# Patient Record
Sex: Female | Born: 1935 | Race: White | Hispanic: No | Marital: Single
Health system: Midwestern US, Academic
[De-identification: ages and names within clinical notes are randomized; demographics above are authoritative.]

---

## 2016-09-20 ENCOUNTER — Encounter: Admit: 2016-09-20 | Discharge: 2016-09-20 | Payer: MEDICARE

## 2016-09-21 MED ORDER — METOPROLOL SUCCINATE 25 MG PO TB24
ORAL_TABLET | Freq: Every day | ORAL | 3 refills | 90.00000 days | Status: AC
Start: 2016-09-21 — End: 2016-12-31

## 2016-12-04 ENCOUNTER — Encounter: Admit: 2016-12-04 | Discharge: 2016-12-04 | Payer: MEDICARE

## 2016-12-04 DIAGNOSIS — E785 Hyperlipidemia, unspecified: Principal | ICD-10-CM

## 2016-12-04 MED ORDER — ATORVASTATIN 20 MG PO TAB
20 mg | ORAL_TABLET | Freq: Every day | ORAL | 3 refills | Status: AC
Start: 2016-12-04 — End: 2016-12-31

## 2016-12-08 ENCOUNTER — Encounter: Admit: 2016-12-08 | Discharge: 2016-12-08 | Payer: MEDICARE

## 2016-12-08 DIAGNOSIS — I2510 Atherosclerotic heart disease of native coronary artery without angina pectoris: Principal | ICD-10-CM

## 2016-12-08 DIAGNOSIS — I10 Essential (primary) hypertension: ICD-10-CM

## 2016-12-08 DIAGNOSIS — E785 Hyperlipidemia, unspecified: ICD-10-CM

## 2016-12-14 ENCOUNTER — Encounter: Admit: 2016-12-14 | Discharge: 2016-12-14 | Payer: MEDICARE

## 2016-12-14 DIAGNOSIS — E785 Hyperlipidemia, unspecified: ICD-10-CM

## 2016-12-14 DIAGNOSIS — I10 Essential (primary) hypertension: ICD-10-CM

## 2016-12-14 DIAGNOSIS — I2510 Atherosclerotic heart disease of native coronary artery without angina pectoris: Principal | ICD-10-CM

## 2016-12-14 LAB — LIPID PROFILE
Lab: 24
Lab: 88
Lab: 121
Lab: 150
Lab: 4

## 2016-12-14 LAB — AST (SGOT): Lab: 22 mg/dL

## 2016-12-14 LAB — ALT (SGPT): Lab: 17

## 2016-12-31 ENCOUNTER — Encounter: Admit: 2016-12-31 | Discharge: 2016-12-31 | Payer: MEDICARE

## 2016-12-31 ENCOUNTER — Ambulatory Visit: Admit: 2016-12-31 | Discharge: 2017-01-01 | Payer: MEDICARE

## 2016-12-31 DIAGNOSIS — E785 Hyperlipidemia, unspecified: ICD-10-CM

## 2016-12-31 DIAGNOSIS — I10 Essential (primary) hypertension: Principal | ICD-10-CM

## 2016-12-31 DIAGNOSIS — I872 Venous insufficiency (chronic) (peripheral): Principal | ICD-10-CM

## 2016-12-31 DIAGNOSIS — R079 Chest pain, unspecified: ICD-10-CM

## 2016-12-31 DIAGNOSIS — G459 Transient cerebral ischemic attack, unspecified: ICD-10-CM

## 2016-12-31 DIAGNOSIS — N390 Urinary tract infection, site not specified: ICD-10-CM

## 2016-12-31 DIAGNOSIS — I2510 Atherosclerotic heart disease of native coronary artery without angina pectoris: ICD-10-CM

## 2016-12-31 MED ORDER — RANOLAZINE 1,000 MG PO TB12
1000 mg | Freq: Two times a day (BID) | ORAL | 0 refills | Status: AC
Start: 2016-12-31 — End: 2016-12-31

## 2016-12-31 MED ORDER — ATORVASTATIN 20 MG PO TAB
20 mg | ORAL_TABLET | Freq: Every day | ORAL | 3 refills | Status: AC
Start: 2016-12-31 — End: 2017-11-04

## 2016-12-31 MED ORDER — RANOLAZINE 1,000 MG PO TB12
1000 mg | ORAL_TABLET | Freq: Two times a day (BID) | ORAL | 5 refills | Status: AC
Start: 2016-12-31 — End: 2017-01-07

## 2016-12-31 MED ORDER — METOPROLOL SUCCINATE 25 MG PO TB24
25 mg | ORAL_TABLET | Freq: Every day | ORAL | 3 refills | 90.00000 days | Status: AC
Start: 2016-12-31 — End: 2017-10-01

## 2016-12-31 MED ORDER — HYDROCHLOROTHIAZIDE 12.5 MG PO TAB
12.5 mg | ORAL_TABLET | Freq: Every morning | ORAL | 3 refills | 30.00000 days | Status: AC
Start: 2016-12-31 — End: 2017-11-04

## 2016-12-31 NOTE — Progress Notes
Date of Service: 12/31/2016    Jillian Rose is a 83 y.o. female.       HPI     Jillian Rose was in the [place] office today for follow-up regarding coronary disease.  She is a delightful lady but she is a little bit difficult to manage because whenever I seen her the problem that she is having that day is the worst thing she is ever had.  When I then see her back 6 months later is always something else and her issues are never anything that I seem to be able to fix very easily.    Despite all this she goes down to [place] every winter and seems to do fine.  She has a bicycle that she rides around and she generally feels a lot better while she is down in [place].    She was coughing a lot in the exam room before I went in.  As we were talking during the exam, however, she stopped coughing entirely.  The cough is a little bit of a wheezy sounding cough to me and honestly sounds more like airway reactivity than it does a dry cough associated with increased lung water.    She has never really had typical angina symptoms but when I had her on Ranexa last year her breathlessness really did seem to get better.  She stopped the drug on her own about 6 months ago because she was concerned about the cost.    I gave her a new prescription for Ranexa today and asked her to call around town to see if she could find a good price for the drug.  She also mentioned the prescription drug program through [facility] and asked whether there would be some possibility about getting the Ranexa through that program.    She is not having any palpitations nor is she having lightheadedness or syncope.  She is complaining about left leg pain and edema.  When we talked about this 6 months ago I suggested that she get compression stockings because the venous duplex we did that day was negative.  She has not followed up on this but I did give her the name of a company in [place] that deals with compression therapy.         Vitals: 12/31/16 0852 12/31/16 0904   BP: 138/90 132/82   Pulse: 100    Weight: 86.4 kg (190 lb 6.4 oz)    Height: 1.575 m (5' 2)      Body mass index is 34.82 kg/m???.     Past Medical History  Patient Active Problem List    Diagnosis Date Noted   ??? Left leg pain 06/18/2016     06/2016 - Left leg swelling and pain.  Venous Duplex negative.  Compression stockings recommended.     ??? Chest pain 10/02/2013   ??? HLD (hyperlipidemia) 10/02/2013   ??? Nephrolithiasis 06/08/2013     03/2013 - Spontaneous passage of kidney stone while in [place]     ??? Venous insufficiency 01/23/2013   ??? Near syncope 10/30/2012   ??? Dyslipidemia 08/04/2012     2014 - Atorvastatin started after CABG and NSTEMI    07/20/12 - Total 155, LDL 101, HDL 44, TG 119.  Atorvastatin doubled to 20 mg/day on 5/29.     ??? CAD (coronary artery disease) 08/03/2012     03/2012- NSTEMI complicating pneumonia.  EF 40-45% by echo.  60% left main, 99% proximal LAD, 60-70% mid-circumflex.  Normal  dominant RCA.  EF 40%, distal anterior severe hypokinesis.  03/22/2012 - CABG X 2 (LIMA-LAD, SVG-OM-2).  [facility], [place], [place]., Dr. Barrett Henle.  06/26/15 Echo+Doppler: EF 65%. Mild Abnormal Septal Motion. Mild concentric LVH. Mild LA dilation. Mild AV regurgitation. PASP 37 mmHg.     ??? HTN (hypertension) 08/03/2012   ??? TIA (transient ischemic attack) 08/03/2012     08/01/2008     ??? Asthma 08/03/2012   ??? Sleep apnea 08/03/2012   ??? GERD (gastroesophageal reflux disease) 08/03/2012         Review of Systems   Constitution: Negative.   HENT: Negative.    Eyes: Negative.    Cardiovascular: Positive for dyspnea on exertion and irregular heartbeat.   Respiratory: Positive for cough and shortness of breath.    Endocrine: Negative.    Hematologic/Lymphatic: Negative.    Skin: Negative.    Musculoskeletal: Negative.    Gastrointestinal: Negative.    Genitourinary: Negative.    Neurological: Positive for numbness and paresthesias.   Psychiatric/Behavioral: Negative. Allergic/Immunologic: Negative.        Physical Exam    Physical Exam   General Appearance: no distress   Skin: warm, no ulcers or xanthomas   Digits and Nails: no cyanosis or clubbing   Eyes: conjunctivae and lids normal, pupils are equal and round   Teeth/Gums/Palate: dentition unremarkable, no lesions   Lips & Oral Mucosa: no pallor or cyanosis   Neck Veins: normal JVP , neck veins are not distended   Thyroid: no nodules, masses, tenderness or enlargement   Chest Inspection: chest is normal in appearance   Respiratory Effort: breathing comfortably, no respiratory distress   Auscultation/Percussion: lungs clear to auscultation, no rales or rhonchi, no wheezing   PMI: PMI not enlarged or displaced   Cardiac Rhythm: regular rhythm and normal rate   Cardiac Auscultation: S1, S2 normal, no rub, no gallop   Murmurs: no murmur   Peripheral Circulation: normal peripheral circulation   Carotid Arteries: normal carotid upstroke bilaterally, no bruits   Radial Arteries: normal symmetric radial pulses   Abdominal Aorta: no abdominal aortic bruit   Pedal Pulses: normal symmetric pedal pulses   Lower Extremity Edema: 1+ left lower extremity edema   Abdominal Exam: soft, non-tender, no masses, bowel sounds normal   Liver & Spleen: no organomegaly   Gait & Station: walks without assistance   Muscle Strength: normal muscle tone   Orientation: oriented to time, place and person   Affect & Mood: appropriate and sustained affect   Language and Memory: patient responsive and seems to comprehend information   Neurologic Exam: neurological assessment grossly intact   Other: moves all extremities        Problems Addressed Today  Encounter Diagnoses   Name Primary?   ??? Venous insufficiency    ??? Coronary artery disease involving native coronary artery of native heart without angina pectoris    ??? Hyperlipidemia, unspecified hyperlipidemia type    ??? Dyslipidemia        Assessment and Plan       Venous insufficiency She needs to try compression stockings for her left leg--she has quite a bit of difficulty with swelling and discomfort.  I think she'll probably need thigh-high compression stockings and I'll look into where she can get fitted for them.    CAD (coronary artery disease)  I don't really think her dyspnea symptoms are likely to be related to her epicardial coronary disease.  I do think she has  a component of endothelial dysfunction which contributes to her diastolic heart failure.  I think she would do better if we could get her back on ranolazine.    There is some sort of prescription drug plan (340-b) available in [place] that she mentioned and we'll look into whether this would help her with the cost of Ranexa.    HLD (hyperlipidemia)  Lab Results   Component Value Date    CHOL 150 12/14/2016    TRIG 121 12/14/2016    HDL 42 12/14/2016    LDL 88 12/14/2016    VLDL 24 12/14/2016    CHOLHDLC 4 12/14/2016      LDL pretty close to goal (< 70).      Current Medications (including today's revisions)  ??? aspirin EC 81 mg tablet Take 81 mg by mouth daily.   ??? atorvastatin (LIPITOR) 20 mg tablet Take one tablet by mouth daily.   ??? brimonidine(+) (ALPHAGAN) 0.2 % ophthalmic solution Insert or Apply 1 Drop to left eye as directed twice daily.   ??? fluticasone (FLONASE) 50 mcg/actuation nasal spray Apply 2 sprays to each nostril as directed daily. Shake bottle gently before using.   ??? fluticasone/salmeterol (ADVAIR DISKUS) 250/50 mcg inhalation disk Inhale 1 puff by mouth into the lungs every 12 hours.   ??? glucosamine-chondroitin 500-400 mg tablet Take 1 tablet by mouth daily.   ??? hydroCHLOROthiazide (HYDRODIURIL) 12.5 mg tablet Take one tablet by mouth every morning.   ??? lactobacillus combination no.4 (SENIOR PROBIOTIC) 15 billion cell cap Take 1 Cap by mouth daily.   ??? magnesium oxide (MAG-OX) 400 mg tablet Take 400 mg by mouth daily.   ??? metoprolol XL (TOPROL XL) 25 mg extended release tablet Take one tablet by mouth daily.   ??? nitroglycerin (NITROSTAT) 0.4 mg tablet Place 1 Tab under tongue every 5 minutes as needed for Chest Pain.   ??? omeprazole DR(+) (PRILOSEC) 20 mg capsule Take 1 Cap by mouth daily.   ??? potassium chloride SR (K-DUR) 20 mEq tablet Take 20 mEq by mouth daily. Take with a meal and a full glass of water.   ??? ranolazine ER (RANEXA) 1,000 mg tablet Take one tablet by mouth twice daily.   ??? tiotropium (SPIRIVA WITH HANDIHALER) 18 mcg capsule for inhaler Place 18 mcg into inhaler and inhale into lungs as directed daily.   ??? vitamins, multiple tablet Take 1 Tab by mouth daily.

## 2016-12-31 NOTE — Assessment & Plan Note
She needs to try compression stockings for her left leg--she has quite a bit of difficulty with swelling and discomfort.  I think she'll probably need thigh-high compression stockings and I'll look into where she can get fitted for them.

## 2016-12-31 NOTE — Assessment & Plan Note
Lab Results   Component Value Date    CHOL 150 12/14/2016    TRIG 121 12/14/2016    HDL 42 12/14/2016    LDL 88 12/14/2016    VLDL 24 12/14/2016    CHOLHDLC 4 12/14/2016      LDL pretty close to goal (< 70).

## 2017-01-05 ENCOUNTER — Encounter: Admit: 2017-01-05 | Discharge: 2017-01-05 | Payer: MEDICARE

## 2017-01-05 NOTE — Telephone Encounter
Pt called to report that the Ranexa is $233.00 per month even with the 340-b program which is for the [facility] pts only.  Even then the cost is $233 per month.  She states that she cannot afford it but  did purchase a one month supply.  Pt would like to be changed to a different medication.. Will send to Dr.Owens for his review and recommendation.

## 2017-01-06 ENCOUNTER — Encounter: Admit: 2017-01-06 | Discharge: 2017-01-06 | Payer: MEDICARE

## 2017-01-06 NOTE — Telephone Encounter
01/06/2017 11:35 AM Patient of Dr. Barry Dienes called today to report symptoms of pounding heartbeat and arm/hand pain like "pins and needles", both of which woke her up last night.  Patient took her first dose of Ranexa 1000 mg BID last night, 01/05/2017, around 9 PM.  At midnight, she "started to feel her heart beating hard."  At 3 AM, she work up with bilateral arm and hand pain and paresthesia.  Patient asks if she should cut her dose in half.  In the past, patient was prescribed 500 mg BID.  Per patient, she did not have side effects on this dosing.    Dr. Barry Dienes prescribed 1000 mg BID at OV on 12/31/2016 to help relieve symptoms of breathlessness.  Patient could not get to the pharmacy until 01/05/17, so took her first dose that evening.    Patient is also concerned about the cost of the medication.  She paid $233 for a 30-day supply.    This morning, patient's symptoms are resolved.  She did not take her morning dose.    I told patient I would send a message to Dr. Barry Dienes for recommendation regarding Ranexa dosing and will follow up with her.  Asked patient to continue to hold the Ranexa until we called her back.    If patient remains on Ranexa, will ask [facility] RNs to follow-up on cost concerns.  Dr. Barry Dienes mentions some prescription drug options in his 12/31/16 OV notes that patient was going to explore.     Patient will go to [place] on November 28 for the winter.    Sent staff message to Dr. Barry Dienes.

## 2017-01-07 MED ORDER — RANOLAZINE 500 MG PO TB12
500 mg | ORAL_TABLET | Freq: Two times a day (BID) | ORAL | 3 refills | Status: AC
Start: 2017-01-07 — End: 2017-01-07

## 2017-01-07 MED ORDER — RANOLAZINE 500 MG PO TB12
500 mg | ORAL_TABLET | Freq: Two times a day (BID) | ORAL | 3 refills | Status: AC
Start: 2017-01-07 — End: 2017-11-04

## 2017-01-08 MED ORDER — ISOSORBIDE MONONITRATE 60 MG PO TB24
60 mg | ORAL_TABLET | Freq: Every morning | ORAL | 3 refills | 30.00000 days | Status: AC
Start: 2017-01-08 — End: 2017-01-19

## 2017-01-08 NOTE — Telephone Encounter
Message   Received: 2 days ago   Message Contents   Vanice Sarah, MD  Cordella Register, RN   Caller: Unspecified (3 days ago, 3:42 PM)            I guess we could switch to Imdur 60 mg/day after the Ranexa runs out. Thanks.    Previous Messages

## 2017-01-08 NOTE — Telephone Encounter
Spoke with pt and she was given the information as below.  Pt wants to pick up a signed script on 01/19/17 so she can take to [place] with her as she will be there until April of 2019.

## 2017-01-19 ENCOUNTER — Encounter: Admit: 2017-01-19 | Discharge: 2017-01-19 | Payer: MEDICARE

## 2017-01-19 MED ORDER — ISOSORBIDE MONONITRATE 60 MG PO TB24
60 mg | ORAL_TABLET | Freq: Every morning | ORAL | 3 refills | 30.00000 days | Status: AC
Start: 2017-01-19 — End: 2017-11-04

## 2017-01-19 NOTE — Telephone Encounter
-----   Message from Cordella RegisterJoyce Wilkerson, RN sent at 01/08/2017  9:12 AM CDT -----  Regarding: Needs script to take with her  Pt wants a signed script for the Imdur to take with her to [place]

## 2017-09-30 ENCOUNTER — Encounter: Admit: 2017-09-30 | Discharge: 2017-09-30 | Payer: MEDICARE

## 2017-10-01 MED ORDER — METOPROLOL SUCCINATE 25 MG PO TB24
ORAL_TABLET | Freq: Every day | ORAL | 1 refills | 90.00000 days | Status: AC
Start: 2017-10-01 — End: 2017-11-04

## 2017-11-04 ENCOUNTER — Ambulatory Visit: Admit: 2017-11-04 | Discharge: 2017-11-05 | Payer: MEDICARE

## 2017-11-04 ENCOUNTER — Encounter: Admit: 2017-11-04 | Discharge: 2017-11-04 | Payer: MEDICARE

## 2017-11-04 DIAGNOSIS — G4733 Obstructive sleep apnea (adult) (pediatric): ICD-10-CM

## 2017-11-04 DIAGNOSIS — N390 Urinary tract infection, site not specified: ICD-10-CM

## 2017-11-04 DIAGNOSIS — R55 Syncope and collapse: Principal | ICD-10-CM

## 2017-11-04 DIAGNOSIS — R079 Chest pain, unspecified: ICD-10-CM

## 2017-11-04 DIAGNOSIS — E785 Hyperlipidemia, unspecified: ICD-10-CM

## 2017-11-04 DIAGNOSIS — I10 Essential (primary) hypertension: Principal | ICD-10-CM

## 2017-11-04 DIAGNOSIS — I2510 Atherosclerotic heart disease of native coronary artery without angina pectoris: ICD-10-CM

## 2017-11-04 DIAGNOSIS — G459 Transient cerebral ischemic attack, unspecified: ICD-10-CM

## 2017-11-04 MED ORDER — ATORVASTATIN 20 MG PO TAB
20 mg | ORAL_TABLET | Freq: Every day | ORAL | 3 refills | Status: AC
Start: 2017-11-04 — End: 2018-04-07

## 2017-11-04 MED ORDER — METOPROLOL SUCCINATE 25 MG PO TB24
25 mg | ORAL_TABLET | Freq: Every day | ORAL | 3 refills | 90.00000 days | Status: AC
Start: 2017-11-04 — End: 2018-04-07

## 2017-11-04 MED ORDER — HYDROCHLOROTHIAZIDE 12.5 MG PO TAB
12.5 mg | ORAL_TABLET | Freq: Every morning | ORAL | 3 refills | 30.00000 days | Status: AC
Start: 2017-11-04 — End: 2018-04-07

## 2017-11-04 MED ORDER — OMEPRAZOLE 20 MG PO CPDR
20 mg | ORAL_CAPSULE | Freq: Every day | ORAL | 3 refills | Status: AC
Start: 2017-11-04 — End: 2018-04-07

## 2017-11-04 MED ORDER — ISOSORBIDE MONONITRATE 60 MG PO TB24
60 mg | ORAL_TABLET | Freq: Every morning | ORAL | 3 refills | 30.00000 days | Status: AC
Start: 2017-11-04 — End: 2018-08-18

## 2018-04-07 ENCOUNTER — Encounter: Admit: 2018-04-07 | Discharge: 2018-04-07 | Payer: MEDICARE

## 2018-04-07 DIAGNOSIS — E785 Hyperlipidemia, unspecified: Secondary | ICD-10-CM

## 2018-04-07 MED ORDER — HYDROCHLOROTHIAZIDE 12.5 MG PO TAB
12.5 mg | ORAL_TABLET | Freq: Every morning | ORAL | 3 refills | 30.00000 days | Status: AC
Start: 2018-04-07 — End: 2018-08-18

## 2018-04-07 MED ORDER — ATORVASTATIN 20 MG PO TAB
20 mg | ORAL_TABLET | Freq: Every day | ORAL | 3 refills | Status: AC
Start: 2018-04-07 — End: 2018-08-18

## 2018-04-07 MED ORDER — OMEPRAZOLE 20 MG PO CPDR
20 mg | ORAL_CAPSULE | Freq: Every day | ORAL | 3 refills | Status: AC
Start: 2018-04-07 — End: 2018-08-18

## 2018-04-07 MED ORDER — METOPROLOL SUCCINATE 25 MG PO TB24
25 mg | ORAL_TABLET | Freq: Every day | ORAL | 3 refills | 90.00000 days | Status: AC
Start: 2018-04-07 — End: 2018-08-18

## 2018-08-18 ENCOUNTER — Ambulatory Visit: Admit: 2018-08-18 | Discharge: 2018-08-19

## 2018-08-18 ENCOUNTER — Encounter: Admit: 2018-08-18 | Discharge: 2018-08-18

## 2018-08-18 DIAGNOSIS — I10 Essential (primary) hypertension: Secondary | ICD-10-CM

## 2018-08-18 DIAGNOSIS — E785 Hyperlipidemia, unspecified: Secondary | ICD-10-CM

## 2018-08-18 DIAGNOSIS — N390 Urinary tract infection, site not specified: Secondary | ICD-10-CM

## 2018-08-18 DIAGNOSIS — I2510 Atherosclerotic heart disease of native coronary artery without angina pectoris: Secondary | ICD-10-CM

## 2018-08-18 DIAGNOSIS — G459 Transient cerebral ischemic attack, unspecified: Secondary | ICD-10-CM

## 2018-08-18 DIAGNOSIS — R079 Chest pain, unspecified: Secondary | ICD-10-CM

## 2018-08-18 MED ORDER — HYDROCHLOROTHIAZIDE 12.5 MG PO TAB
12.5 mg | ORAL_TABLET | Freq: Every morning | ORAL | 3 refills | 30.00000 days | Status: DC
Start: 2018-08-18 — End: 2019-07-20

## 2018-08-18 MED ORDER — METOPROLOL SUCCINATE 25 MG PO TB24
25 mg | ORAL_TABLET | Freq: Every day | ORAL | 3 refills | 90.00000 days | Status: DC
Start: 2018-08-18 — End: 2019-07-20

## 2018-08-18 MED ORDER — OMEPRAZOLE 20 MG PO CPDR
20 mg | ORAL_CAPSULE | Freq: Every day | ORAL | 3 refills | Status: DC
Start: 2018-08-18 — End: 2019-07-20

## 2018-08-18 MED ORDER — ATORVASTATIN 20 MG PO TAB
20 mg | ORAL_TABLET | Freq: Every day | ORAL | 3 refills | Status: DC
Start: 2018-08-18 — End: 2019-07-18

## 2018-08-18 MED ORDER — ISOSORBIDE MONONITRATE 60 MG PO TB24
60 mg | ORAL_TABLET | Freq: Every morning | ORAL | 3 refills | 30.00000 days | Status: DC
Start: 2018-08-18 — End: 2019-07-03

## 2018-08-18 NOTE — Progress Notes
Date of Service: 08/18/2018    Jillian Rose is a 85 y.o. female.       HPI     Jillian Rose was in the [place] office for follow-up regarding coronary disease.  She had a great winter down in [place] and is so happy to be spending time with her new significant other, a man from [place], [place].  They separate to their homes in the summer months, but get together again down in [place] for the winter.    She's having a bit more trouble with her reactive airways disease now that she's back in [place].  She's currently on azythromycin and corticosteroid.    She really hasn't had any more trouble with angina, palpitations, or light headedness.  She denies any TIA symptoms.  Her BP has been doing well on the current medications.    She denies any claudication or fluid retention.         Vitals:    08/18/18 1550 08/18/18 1559   BP: 128/74 124/72   BP Source: Arm, Left Upper Arm, Right Upper   Pulse: 66    SpO2: 96%    Weight: 79.8 kg (176 lb)    Height: 1.575 m (5' 2)    PainSc: Zero      Body mass index is 32.19 kg/m???.     Past Medical History  Patient Active Problem List    Diagnosis Date Noted   ??? Left leg pain 06/18/2016     06/2016 - Left leg swelling and pain.  Venous Duplex negative.  Compression stockings recommended.     ??? Chest pain 10/02/2013   ??? HLD (hyperlipidemia) 10/02/2013   ??? Nephrolithiasis 06/08/2013     03/2013 - Spontaneous passage of kidney stone while in [place]     ??? Venous insufficiency 01/23/2013   ??? Near syncope 10/30/2012   ??? Dyslipidemia 08/04/2012     2014 - Atorvastatin started after CABG and NSTEMI    07/20/12 - Total 155, LDL 101, HDL 44, TG 119.  Atorvastatin doubled to 20 mg/day on 5/29.     ??? CAD (coronary artery disease) 08/03/2012     03/2012- NSTEMI complicating pneumonia.  EF 40-45% by echo.  60% left main, 99% proximal LAD, 60-70% mid-circumflex.  Normal dominant RCA.  EF 40%, distal anterior severe hypokinesis.  03/22/2012 - CABG X 2 (LIMA-LAD, SVG-OM-2).  [facility], [place], [place]., Dr. Barrett Henle.  06/26/15 Echo+Doppler: EF 65%. Mild Abnormal Septal Motion. Mild concentric LVH. Mild LA dilation. Mild AV regurgitation. PASP 37 mmHg.     ??? HTN (hypertension) 08/03/2012   ??? TIA (transient ischemic attack) 08/03/2012     08/01/2008     ??? Asthma 08/03/2012   ??? Sleep apnea 08/03/2012   ??? GERD (gastroesophageal reflux disease) 08/03/2012         Review of Systems   Constitution: Negative.   HENT: Negative.    Eyes: Negative.    Cardiovascular: Negative.    Respiratory: Positive for shortness of breath.    Endocrine: Negative.    Hematologic/Lymphatic: Negative.    Skin: Negative.    Musculoskeletal: Negative.    Gastrointestinal: Negative.    Genitourinary: Negative.    Neurological: Negative.    Psychiatric/Behavioral: Negative.    Allergic/Immunologic: Negative.        Physical Exam    General Appearance: no distress   Skin: warm, no ulcers or xanthomas   Digits and Nails: no cyanosis or clubbing   Eyes: conjunctivae and  lids normal, pupils are equal and round   Teeth/Gums/Palate: dentition unremarkable, no lesions   Lips & Oral Mucosa: no pallor or cyanosis   Neck Veins: normal JVP , neck veins are not distended   Thyroid: no nodules, masses, tenderness or enlargement   Chest Inspection: chest is normal in appearance   Respiratory Effort: breathing comfortably, no respiratory distress   Auscultation/Percussion: lungs clear to auscultation, no rales or rhonchi, no wheezing   PMI: PMI not enlarged or displaced   Cardiac Rhythm: regular rhythm and normal rate   Cardiac Auscultation: S1, S2 normal, no rub, no gallop   Murmurs: no murmur   Peripheral Circulation: normal peripheral circulation   Carotid Arteries: normal carotid upstroke bilaterally, no bruits   Radial Arteries: normal symmetric radial pulses   Abdominal Aorta: no abdominal aortic bruit   Pedal Pulses: normal symmetric pedal pulses   Lower Extremity Edema: no lower extremity edema Abdominal Exam: soft, non-tender, no masses, bowel sounds normal   Liver & Spleen: no organomegaly   Gait & Station: walks without assistance   Muscle Strength: normal muscle tone   Orientation: oriented to time, place and person   Affect & Mood: appropriate and sustained affect   Language and Memory: patient responsive and seems to comprehend information   Neurologic Exam: neurological assessment grossly intact   Other: moves all extremities      Problems Addressed Today  Encounter Diagnoses   Name Primary?   ??? Dyslipidemia    ??? Coronary artery disease involving native coronary artery of native heart without angina pectoris    ??? Essential hypertension        Assessment and Plan       Dyslipidemia  Lab Results   Component Value Date    CHOL 150 08/19/2018    TRIG 253 (H) 08/19/2018    HDL 34 (L) 08/19/2018    LDL 65 08/19/2018    VLDL 51 (H) 08/19/2018    CHOLHDLC 4 08/19/2018      LDL looks great on current therapy.     CAD (coronary artery disease)  No angina, I'm not planning surveillance stress testing unless she develops worrisome symptoms.    HTN (hypertension)  BP treated to goal, average less than 130/80.      Current Medications (including today's revisions)  ??? aspirin EC 81 mg tablet Take 81 mg by mouth daily.   ??? atorvastatin (LIPITOR) 20 mg tablet Take one tablet by mouth daily.   ??? benzonatate (TESSALON) 200 mg capsule Take 1 capsule by mouth as Needed.   ??? brimonidine(+) (ALPHAGAN) 0.2 % ophthalmic solution Insert or Apply 1 Drop to left eye as directed twice daily.   ??? cholecalciferol (VITAMIN D) 1,000 units tablet Take 1,000 Units by mouth daily.   ??? fluticasone/salmeterol (ADVAIR DISKUS) 250/50 mcg inhalation disk Inhale 1 puff by mouth into the lungs every 12 hours.   ??? hydroCHLOROthiazide (HYDRODIURIL) 12.5 mg tablet Take one tablet by mouth every morning.   ??? isosorbide mononitrate SR (IMDUR) 60 mg tablet Take one tablet by mouth every morning. ??? lactobacillus combination no.4 (SENIOR PROBIOTIC) 15 billion cell cap Take 1 Cap by mouth daily.   ??? metoprolol XL (TOPROL XL) 25 mg extended release tablet Take one tablet by mouth daily.   ??? neomycin/polymyxin/HC (CORTISPORIN) 3.5-10,000-1 mg-unit/mL-% otic suspension Place 4 drops in affected ear(s) as directed three times daily.   ??? omeprazole DR (PRILOSEC) 20 mg capsule Take one capsule by mouth daily.   ???  Potassium 99 mg tab Take 1 tablet by mouth daily.   ??? timolol maleate (TIMOPTIC) 0.5 % ophthalmic drops 1 drop twice daily.   ??? tiotropium (SPIRIVA WITH HANDIHALER) 18 mcg capsule for inhaler Place 18 mcg into inhaler and inhale into lungs as directed daily.   ??? vitamins, multiple tablet Take 1 Tab by mouth daily.

## 2018-08-18 NOTE — Patient Instructions
1.  We'll order lab work for [facility] lab  2.  New prescriptions sent today.

## 2018-08-19 ENCOUNTER — Encounter: Admit: 2018-08-19 | Discharge: 2018-08-19

## 2018-08-19 DIAGNOSIS — E785 Hyperlipidemia, unspecified: Secondary | ICD-10-CM

## 2018-08-19 LAB — CBC
Lab: 4.3
Lab: 7.7

## 2018-08-19 LAB — COMPREHENSIVE METABOLIC PANEL
Lab: 0.5
Lab: 3.4
Lab: 6.7
Lab: 91
Lab: 106
Lab: 13
Lab: 143 — ABNORMAL HIGH (ref <150)
Lab: 28 — ABNORMAL HIGH (ref 5–40)
Lab: 4.6 — ABNORMAL LOW (ref >40)

## 2018-08-19 LAB — LIPID PROFILE: Lab: 150

## 2018-08-20 NOTE — Assessment & Plan Note
BP treated to goal, average less than 130/80.

## 2018-08-20 NOTE — Assessment & Plan Note
No angina, I'm not planning surveillance stress testing unless she develops worrisome symptoms.

## 2018-08-22 ENCOUNTER — Encounter: Admit: 2018-08-22 | Discharge: 2018-08-22

## 2018-08-22 NOTE — Telephone Encounter
Called pt with lab results. Pt very pleased we called with results.  Labs sent to [facility] for review.  Pt reports she wasn't fasting a full 10-12 hours prior.  She was only fasting about 6 hours.  Advised pt we would call her back if [facility] has further recommendations.

## 2019-07-03 MED ORDER — ISOSORBIDE MONONITRATE 60 MG PO TB24
ORAL_TABLET | Freq: Every morning | ORAL | 3 refills | 30.00000 days | Status: AC
Start: 2019-07-03 — End: ?

## 2019-07-18 ENCOUNTER — Encounter: Admit: 2019-07-18 | Discharge: 2019-07-18 | Payer: MEDICARE

## 2019-07-18 DIAGNOSIS — E785 Hyperlipidemia, unspecified: Secondary | ICD-10-CM

## 2019-07-18 MED ORDER — ATORVASTATIN 20 MG PO TAB
ORAL_TABLET | Freq: Every day | 3 refills | Status: AC
Start: 2019-07-18 — End: ?

## 2019-07-19 ENCOUNTER — Encounter: Admit: 2019-07-19 | Discharge: 2019-07-19 | Payer: MEDICARE

## 2019-07-20 MED ORDER — METOPROLOL SUCCINATE 25 MG PO TB24
ORAL_TABLET | Freq: Every day | ORAL | 3 refills | 90.00000 days | Status: AC
Start: 2019-07-20 — End: ?

## 2019-07-20 MED ORDER — OMEPRAZOLE 20 MG PO CPDR
ORAL_CAPSULE | Freq: Every day | 3 refills | Status: AC
Start: 2019-07-20 — End: ?

## 2019-07-20 MED ORDER — HYDROCHLOROTHIAZIDE 12.5 MG PO TAB
ORAL_TABLET | Freq: Every morning | ORAL | 3 refills | 30.00000 days | Status: AC
Start: 2019-07-20 — End: ?

## 2019-08-23 ENCOUNTER — Encounter: Admit: 2019-08-23 | Discharge: 2019-08-23 | Payer: MEDICARE

## 2019-08-23 DIAGNOSIS — I4891 Unspecified atrial fibrillation: Secondary | ICD-10-CM

## 2019-08-23 DIAGNOSIS — I639 Cerebral infarction, unspecified: Secondary | ICD-10-CM

## 2019-10-05 ENCOUNTER — Encounter: Admit: 2019-10-05 | Discharge: 2019-10-05 | Payer: MEDICARE

## 2019-10-05 DIAGNOSIS — N390 Urinary tract infection, site not specified: Secondary | ICD-10-CM

## 2019-10-05 DIAGNOSIS — I2510 Atherosclerotic heart disease of native coronary artery without angina pectoris: Secondary | ICD-10-CM

## 2019-10-05 DIAGNOSIS — I10 Essential (primary) hypertension: Secondary | ICD-10-CM

## 2019-10-05 DIAGNOSIS — E785 Hyperlipidemia, unspecified: Secondary | ICD-10-CM

## 2019-10-05 DIAGNOSIS — R079 Chest pain, unspecified: Secondary | ICD-10-CM

## 2019-10-05 DIAGNOSIS — G459 Transient cerebral ischemic attack, unspecified: Secondary | ICD-10-CM

## 2019-10-05 NOTE — Assessment & Plan Note
Lab Results   Component Value Date    CHOL 150 08/19/2018    TRIG 253 (H) 08/19/2018    HDL 34 (L) 08/19/2018    LDL 65 08/19/2018    VLDL 51 (H) 08/19/2018    CHOLHDLC 4 08/19/2018      LDL treated to goal.

## 2019-10-05 NOTE — Progress Notes
Date of Service: 10/05/2019    Jillian Rose is a 86 y.o. female.       HPI     Jillian Rose was in the [facility] today for follow-up regarding coronary disease.  She still has her boyfriend in [place]; he's living back at his home in [place] for the summer.  They did go through with a Blessing of a Committed Relationship through the [employer] down in [place].    She has a lot of trouble with allergies, sinusitis, and reactive airways problems when she's back in [place].  She sees Dr. Doran Rose for her reactive airways disease.  She's been having more trouble with palpitations dating back to her time in [place] last winter.    [facility]  She's out working in her yard all the time.    She is having a little bit of exertional breathlessness but this is pretty normal for her when she is back in [place]. She is not having angina. She denies any symptoms that would suggest sustained tachycardia and she has had no syncope or near syncope. She denies any TIA or stroke symptoms.         Vitals:    10/05/19 0941 10/05/19 0942   BP: 138/70 136/70   BP Source: Arm, Left Upper Arm, Right Upper   Patient Position: Sitting Sitting   Pulse: 98    SpO2: 97%    Weight: 78.8 kg (173 lb 12.8 oz)    Height: 1.575 m (5' 2)    PainSc: Zero      Body mass index is 31.79 kg/m?Marland Kitchen     Past Medical History  Patient Active Problem List    Diagnosis Date Noted   ? Left leg pain 06/18/2016     06/2016 - Left leg swelling and pain.  Venous Duplex negative.  Compression stockings recommended.     ? Chest pain 10/02/2013   ? HLD (hyperlipidemia) 10/02/2013   ? Nephrolithiasis 06/08/2013     03/2013 - Spontaneous passage of kidney stone while in [place]     ? Venous insufficiency 01/23/2013   ? Near syncope 10/30/2012   ? Dyslipidemia 08/04/2012     2014 - Atorvastatin started after CABG and NSTEMI    07/20/12 - Total 155, LDL 101, HDL 44, TG 119.  Atorvastatin doubled to 20 mg/day on 5/29.     ? CAD (coronary artery disease) 08/03/2012     03/2012- NSTEMI complicating pneumonia.  EF 40-45% by echo.  60% left main, 99% proximal LAD, 60-70% mid-circumflex.  Normal dominant RCA.  EF 40%, distal anterior severe hypokinesis.  03/22/2012 - CABG X 2 (LIMA-LAD, SVG-OM-2).  [facility], [place], [place]., Dr. Barrett Henle.  06/26/15 Echo+Doppler: EF 65%. Mild Abnormal Septal Motion. Mild concentric LVH. Mild LA dilation. Mild AV regurgitation. PASP 37 mmHg.     ? HTN (hypertension) 08/03/2012   ? TIA (transient ischemic attack) 08/03/2012     08/01/2008     ? Asthma 08/03/2012   ? Sleep apnea 08/03/2012   ? GERD (gastroesophageal reflux disease) 08/03/2012         Review of Systems   Constitution: Negative.   HENT: Positive for congestion.    Eyes: Negative.    Cardiovascular: Negative.    Respiratory: Positive for shortness of breath.    Endocrine: Negative.    Hematologic/Lymphatic: Negative.    Skin: Negative.    Musculoskeletal: Negative.    Gastrointestinal: Negative.    Genitourinary:  Negative.    Neurological: Negative.    Psychiatric/Behavioral: Negative.    Allergic/Immunologic: Negative.        Physical Exam    Physical Exam   General Appearance: no distress   Skin: warm, no ulcers or xanthomas   Digits and Nails: no cyanosis or clubbing   Eyes: conjunctivae and lids normal, pupils are equal and round   Teeth/Gums/Palate: dentition unremarkable, no lesions   Lips & Oral Mucosa: no pallor or cyanosis   Neck Veins: normal JVP , neck veins are not distended   Thyroid: no nodules, masses, tenderness or enlargement   Chest Inspection: chest is normal in appearance   Respiratory Effort: breathing comfortably, no respiratory distress   Auscultation/Percussion: lungs clear to auscultation, no rales or rhonchi, no wheezing   PMI: PMI not enlarged or displaced   Cardiac Rhythm: regular rhythm and normal rate   Cardiac Auscultation: S1, S2 normal, no rub, no gallop   Murmurs: no murmur   Peripheral Circulation: normal peripheral circulation   Carotid Arteries: normal carotid upstroke bilaterally, no bruits   Radial Arteries: normal symmetric radial pulses   Abdominal Aorta: no abdominal aortic bruit   Pedal Pulses: normal symmetric pedal pulses   Lower Extremity Edema: no lower extremity edema   Abdominal Exam: soft, non-tender, no masses, bowel sounds normal   Liver & Spleen: no organomegaly   Gait & Station: walks without assistance   Muscle Strength: normal muscle tone   Orientation: oriented to time, place and person   Affect & Mood: appropriate and sustained affect   Language and Memory: patient responsive and seems to comprehend information   Neurologic Exam: neurological assessment grossly intact   Other: moves all extremities      Cardiovascular Studies    EKG:  Sinus rhythm, rate 89.  Normal EKG    Problems Addressed Today  Encounter Diagnoses   Name Primary?   ? Coronary artery disease involving native coronary artery of native heart without angina pectoris    ? Hyperlipidemia, unspecified hyperlipidemia type    ? Essential hypertension        Assessment and Plan       CAD (coronary artery disease)  She isn't having angina symptoms.    HLD (hyperlipidemia)  Lab Results   Component Value Date    CHOL 150 08/19/2018    TRIG 253 (H) 08/19/2018    HDL 34 (L) 08/19/2018    LDL 65 08/19/2018    VLDL 51 (H) 08/19/2018    CHOLHDLC 4 08/19/2018      LDL treated to goal.    HTN (hypertension)  BP looks fine on current medical therapy.      Current Medications (including today's revisions)  ? aspirin EC 81 mg tablet Take 81 mg by mouth daily.   ? atorvastatin (LIPITOR) 20 mg tablet TAKE 1 TABLET EVERY DAY   ? benzonatate (TESSALON) 200 mg capsule Take 1 capsule by mouth as Needed.   ? brimonidine(+) (ALPHAGAN) 0.2 % ophthalmic solution Insert or Apply 1 Drop to left eye as directed twice daily.   ? cholecalciferol (VITAMIN D) 1,000 units tablet Take 1,000 Units by mouth daily.   ? fluticasone/salmeterol (ADVAIR DISKUS) 250/50 mcg inhalation disk Inhale 1 puff by mouth into the lungs every 12 hours.   ? hydroCHLOROthiazide (HYDRODIURIL) 12.5 mg tablet TAKE 1 TABLET EVERY MORNING   ? isosorbide mononitrate ER (IMDUR) 60 mg tablet TAKE 1 TABLET EVERY MORNING   ? lactobacillus combination no.4 (SENIOR PROBIOTIC)  15 billion cell cap Take 1 Cap by mouth daily.   ? latanoprost (XALATAN) 0.005 % ophthalmic solution    ? metoprolol XL (TOPROL XL) 25 mg extended release tablet TAKE 1 TABLET EVERY DAY   ? neomycin/polymyxin/HC (CORTISPORIN) 3.5-10,000-1 mg-unit/mL-% otic suspension Place 4 drops in affected ear(s) as directed three times daily.   ? omeprazole DR (PRILOSEC) 20 mg capsule TAKE 1 CAPSULE EVERY DAY   ? Potassium 99 mg tab Take 1 tablet by mouth daily.   ? timolol maleate (TIMOPTIC) 0.5 % ophthalmic drops 1 drop twice daily.   ? tiotropium (SPIRIVA WITH HANDIHALER) 18 mcg capsule for inhaler Place 18 mcg into inhaler and inhale into lungs as directed daily.   ? vitamins, multiple tablet Take 1 Tab by mouth daily.   ? Zinc 50 mg tab Take  by mouth.     Total time spent on today's office visit was 30 minutes.  This includes face-to-face in person visit with patient as well as nonface-to-face time including review of the EMR, outside records, labs, radiologic studies, echocardiogram & other cardiovascular studies, formation of treatment plan, after visit summary, future disposition, and lastly on documentation.

## 2019-10-05 NOTE — Assessment & Plan Note
She isn't having angina symptoms.

## 2019-10-05 NOTE — Assessment & Plan Note
BP looks fine on current medical therapy.

## 2020-06-17 ENCOUNTER — Encounter: Admit: 2020-06-17 | Discharge: 2020-06-17 | Payer: MEDICARE

## 2020-06-17 DIAGNOSIS — E785 Hyperlipidemia, unspecified: Secondary | ICD-10-CM

## 2020-06-17 MED ORDER — HYDROCHLOROTHIAZIDE 12.5 MG PO TAB
ORAL_TABLET | Freq: Every morning | ORAL | 3 refills | 30.00000 days | Status: AC
Start: 2020-06-17 — End: ?

## 2020-06-17 MED ORDER — ATORVASTATIN 20 MG PO TAB
ORAL_TABLET | Freq: Every day | 3 refills | Status: AC
Start: 2020-06-17 — End: ?

## 2020-06-17 MED ORDER — OMEPRAZOLE 20 MG PO CPDR
ORAL_CAPSULE | Freq: Every day | 3 refills | Status: AC
Start: 2020-06-17 — End: ?

## 2020-06-17 MED ORDER — METOPROLOL SUCCINATE 25 MG PO TB24
ORAL_TABLET | Freq: Every day | ORAL | 3 refills | 90.00000 days | Status: AC
Start: 2020-06-17 — End: ?

## 2020-06-19 ENCOUNTER — Encounter: Admit: 2020-06-19 | Discharge: 2020-06-19 | Payer: MEDICARE

## 2020-06-19 NOTE — Telephone Encounter
-----   Message from Vanice Sarah, MD sent at 06/19/2020  3:08 PM CDT -----  I got a call from Fredia Sorrow and Jaycey is in her clinic with AF and feeling poorly.  Rate OK.  Starting Eliquis 2.5 mg BID today.  I see my schedule is a disaster tomorrow, but I"ll need to see [place] tomorrow morning in [place].  I may offer  admit to get her cardioverted sooner than July if we have to schedule it outpatient!  Can you please call her to give her a time for tomorrow AM?  Thanks.

## 2020-06-19 NOTE — Telephone Encounter
Called pt to schedule appointment tomorrow at 8:45 in [place] with [facility].  Pt confirmed appointment time and location.

## 2020-06-20 ENCOUNTER — Encounter: Admit: 2020-06-20 | Discharge: 2020-06-20 | Payer: MEDICARE

## 2020-06-20 DIAGNOSIS — R079 Chest pain, unspecified: Secondary | ICD-10-CM

## 2020-06-20 DIAGNOSIS — I2510 Atherosclerotic heart disease of native coronary artery without angina pectoris: Secondary | ICD-10-CM

## 2020-06-20 DIAGNOSIS — E785 Hyperlipidemia, unspecified: Secondary | ICD-10-CM

## 2020-06-20 DIAGNOSIS — I4891 Unspecified atrial fibrillation: Secondary | ICD-10-CM

## 2020-06-20 DIAGNOSIS — I4819 Other persistent atrial fibrillation: Secondary | ICD-10-CM

## 2020-06-20 DIAGNOSIS — I491 Atrial premature depolarization: Secondary | ICD-10-CM

## 2020-06-20 DIAGNOSIS — I10 Essential (primary) hypertension: Secondary | ICD-10-CM

## 2020-06-20 DIAGNOSIS — G459 Transient cerebral ischemic attack, unspecified: Secondary | ICD-10-CM

## 2020-06-20 DIAGNOSIS — N390 Urinary tract infection, site not specified: Secondary | ICD-10-CM

## 2020-06-20 NOTE — Assessment & Plan Note
She definitely has coronary disease and for that reason I do not think we can use something like flecainide.  I do not think she is having symptoms of cardiac ischemia currently and I am not planning a stress test at this point.

## 2020-06-20 NOTE — Assessment & Plan Note
Her blood pressure seems okay on the current medical program.

## 2020-06-20 NOTE — Assessment & Plan Note
Lab Results   Component Value Date    CHOL 150 08/19/2018    TRIG 253 (H) 08/19/2018    HDL 34 (L) 08/19/2018    LDL 65 08/19/2018    VLDL 51 (H) 08/19/2018    CHOLHDLC 4 08/19/2018      ]

## 2020-06-20 NOTE — Assessment & Plan Note
She was started on Eliquis yesterday.  Her ventricular rate is reasonable and I do not think we need to make any changes in metoprolol dosing at this point.  We have no idea how long she has been in atrial fibrillation.  She does have some symptoms so I think it is reasonable to make an effort to get her back into sinus rhythm.  We talked about timing and the option of either going to [place] for TEE cardioversion or waiting a month for anticoagulation and an elective cardioversion here in [place].  I think her symptoms status is such that the latter option will be acceptable.  We will plan on getting a transthoracic echocardiogram here in [place] within the next week or 2.  We talked about the possibility of starting an antiarrhythmic medication a week or so before the cardioversion, most likely amiodarone.  If the medication does not convert her then will schedule a cardioversion here in [place] on May 12.  She will need to be on anticoagulation indefinitely because of the silent nature of her arrhythmia.  She will probably also need to be on an antiarrhythmic agent such as amiodarone indefinitely as well.

## 2020-06-20 NOTE — Progress Notes
Date of Service: 06/20/2020    Jillian Rose is a 87 y.o. female.       HPI     Jillian Rose was in the [facility] [place] clinic today for an urgent workin visit regarding new onset atrial fibrillation.  She was accompanied by her daughter.    Her medical insurance provided a home health nurse who did a status check yesterday and determined that Jillian Rose's rhythm was irregular.  She was seen by Dr. Alona Bene in clinic yesterday and an EKG confirms atrial fibrillation with a ventricular rate in the range of 80-90.    Jillian Rose was unaware of the arrhythmia.  She always has an exacerbation of her allergies and reactive airways disease when she comes back to [place] from [place].  It sounds like the weather in [place] was pretty poor are all winter with a lot of wind that kept things stirred up there and I think she probably had trouble with her asthma all winter long.  I do not know that her breathing issues are specifically related to the atrial fibrillation but she is having a lot of complaints about fatigue and a little bit of peripheral edema and I think those symptoms may be more likely to be related to the atrial fibrillation.    It is impossible to tell how long she has been out of rhythm.  Fortunately she has not had any TIA or stroke symptoms.    She is not having any angina or other types of cardiac ischemic symptoms.  She has had no syncope or near syncope.  Again, she denies any palpitations.         Vitals:    06/20/20 0812   BP: 132/78   BP Source: Arm, Right Upper   Pulse: 117   SpO2: 98%   O2 Device: None (Room air)   PainSc: Zero   Weight: 77.1 kg (170 lb)   Height: 157.5 cm (5' 2)     Body mass index is 31.09 kg/m?Marland Kitchen     Past Medical History  Patient Active Problem List    Diagnosis Date Noted   ? Persistent atrial fibrillation (HCC) 06/20/2020   ? Left leg pain 06/18/2016     06/2016 - Left leg swelling and pain.  Venous Duplex negative.  Compression stockings recommended.     ? Chest pain 10/02/2013   ? HLD (hyperlipidemia) 10/02/2013   ? Nephrolithiasis 06/08/2013     03/2013 - Spontaneous passage of kidney stone while in [place]     ? Venous insufficiency 01/23/2013   ? Near syncope 10/30/2012   ? Dyslipidemia 08/04/2012     2014 - Atorvastatin started after CABG and NSTEMI    07/20/12 - Total 155, LDL 101, HDL 44, TG 119.  Atorvastatin doubled to 20 mg/day on 5/29.     ? CAD (coronary artery disease) 08/03/2012     03/2012- NSTEMI complicating pneumonia.  EF 40-45% by echo.  60% left main, 99% proximal LAD, 60-70% mid-circumflex.  Normal dominant RCA.  EF 40%, distal anterior severe hypokinesis.  03/22/2012 - CABG X 2 (LIMA-LAD, SVG-OM-2).  [facility], [place], [place]., Dr. Barrett Henle.  06/26/15 Echo+Doppler: EF 65%. Mild Abnormal Septal Motion. Mild concentric LVH. Mild LA dilation. Mild AV regurgitation. PASP 37 mmHg.     ? HTN (hypertension) 08/03/2012   ? TIA (transient ischemic attack) 08/03/2012     08/01/2008     ? Asthma 08/03/2012   ? Sleep apnea 08/03/2012   ? GERD (gastroesophageal reflux  disease) 08/03/2012         Review of Systems   Constitutional: Positive for malaise/fatigue.   HENT: Negative.    Eyes: Negative.    Cardiovascular: Positive for dyspnea on exertion and irregular heartbeat.   Respiratory: Negative.    Endocrine: Negative.    Hematologic/Lymphatic: Negative.    Skin: Negative.    Musculoskeletal: Negative.    Gastrointestinal: Negative.    Genitourinary: Negative.    Neurological: Negative.    Psychiatric/Behavioral: Negative.    Allergic/Immunologic: Negative.        Physical Exam    Physical Exam   General Appearance: no distress   Skin: warm, no ulcers or xanthomas   Digits and Nails: no cyanosis or clubbing   Eyes: conjunctivae and lids normal, pupils are equal and round   Teeth/Gums/Palate: dentition unremarkable, no lesions   Lips & Oral Mucosa: no pallor or cyanosis   Neck Veins: normal JVP , neck veins are not distended   Thyroid: no nodules, masses, tenderness or enlargement   Chest Inspection: chest is normal in appearance   Respiratory Effort: breathing comfortably, no respiratory distress   Auscultation/Percussion: lungs clear to auscultation, no rales or rhonchi, no wheezing   PMI: PMI not enlarged or displaced   Cardiac Rhythm: irregular rhythm and normal rate   Cardiac Auscultation: S1, S2 normal, no rub, no gallop   Murmurs: no murmur   Peripheral Circulation: normal peripheral circulation   Carotid Arteries: normal carotid upstroke bilaterally, no bruits   Radial Arteries: normal symmetric radial pulses   Abdominal Aorta: no abdominal aortic bruit   Pedal Pulses: normal symmetric pedal pulses   Lower Extremity Edema: trace bilateral lower extremity edema   Abdominal Exam: soft, non-tender, no masses, bowel sounds normal   Liver & Spleen: no organomegaly   Gait & Station: walks without assistance   Muscle Strength: normal muscle tone   Orientation: oriented to time, place and person   Affect & Mood: appropriate and sustained affect   Language and Memory: patient responsive and seems to comprehend information   Neurologic Exam: neurological assessment grossly intact   Other: moves all extremities      Cardiovascular Studies    EKG:  Atrial fibrillation, rate 97    Cardiovascular Health Factors  Vitals BP Readings from Last 3 Encounters:   06/20/20 132/78   10/05/19 136/70   08/18/18 124/72     Wt Readings from Last 3 Encounters:   06/20/20 77.1 kg (170 lb)   10/05/19 78.8 kg (173 lb 12.8 oz)   08/18/18 79.8 kg (176 lb)     BMI Readings from Last 3 Encounters:   06/20/20 31.09 kg/m?   10/05/19 31.79 kg/m?   08/18/18 32.19 kg/m?      Smoking Social History     Tobacco Use   Smoking Status Never Smoker   Smokeless Tobacco Never Used      Lipid Profile Cholesterol   Date Value Ref Range Status   08/19/2018 150  Final     HDL   Date Value Ref Range Status   08/19/2018 34 (L) >40 Final     LDL   Date Value Ref Range Status   08/19/2018 65  Final     Triglycerides   Date Value Ref Range Status   08/19/2018 253 (H) <150 Final      Blood Sugar No results found for: HGBA1C  Glucose   Date Value Ref Range Status   01/09/2020 128 (H) 70 - 105  Final   08/19/2018 91  Final   10/03/2013 100 70 - 100 MG/DL Final          Problems Addressed Today  Encounter Diagnoses   Name Primary?   ? Coronary artery disease involving native coronary artery of native heart without angina pectoris Yes   ? Primary hypertension    ? Finding of multiple premature atrial contractions by electrocardiography    ? Persistent atrial fibrillation (HCC)    ? Hyperlipidemia, unspecified hyperlipidemia type        Assessment and Plan       Persistent atrial fibrillation (HCC)  She was started on Eliquis yesterday.  Her ventricular rate is reasonable and I do not think we need to make any changes in metoprolol dosing at this point.  We have no idea how long she has been in atrial fibrillation.  She does have some symptoms so I think it is reasonable to make an effort to get her back into sinus rhythm.  We talked about timing and the option of either going to [place] for TEE cardioversion or waiting a month for anticoagulation and an elective cardioversion here in [place].  I think her symptoms status is such that the latter option will be acceptable.  We will plan on getting a transthoracic echocardiogram here in [place] within the next week or 2.  We talked about the possibility of starting an antiarrhythmic medication a week or so before the cardioversion, most likely amiodarone.  If the medication does not convert her then will schedule a cardioversion here in [place] on May 12.  She will need to be on anticoagulation indefinitely because of the silent nature of her arrhythmia.  She will probably also need to be on an antiarrhythmic agent such as amiodarone indefinitely as well.    HTN (hypertension)  Her blood pressure seems okay on the current medical program.    HLD (hyperlipidemia)  Lab Results   Component Value Date    CHOL 150 08/19/2018    TRIG 253 (H) 08/19/2018    HDL 34 (L) 08/19/2018    LDL 65 08/19/2018    VLDL 51 (H) 08/19/2018    CHOLHDLC 4 08/19/2018      ]    CAD (coronary artery disease)  She definitely has coronary disease and for that reason I do not think we can use something like flecainide.  I do not think she is having symptoms of cardiac ischemia currently and I am not planning a stress test at this point.      Current Medications (including today's revisions)  ? aspirin EC 81 mg tablet Take 81 mg by mouth daily.   ? atorvastatin (LIPITOR) 20 mg tablet TAKE 1 TABLET EVERY DAY   ? benzonatate (TESSALON) 200 mg capsule Take 1 capsule by mouth as Needed.   ? brimonidine(+) (ALPHAGAN) 0.2 % ophthalmic solution Insert or Apply 1 Drop to left eye as directed twice daily.   ? cholecalciferol (VITAMIN D) 1,000 units tablet Take 1,000 Units by mouth daily.   ? ELIQUIS 2.5 mg tablet Take 1 tablet by mouth twice daily.   ? fluticasone/salmeterol (ADVAIR DISKUS) 250/50 mcg inhalation disk Inhale 1 puff by mouth into the lungs every 12 hours.   ? hydroCHLOROthiazide 12.5 mg tablet TAKE 1 TABLET EVERY MORNING   ? isosorbide mononitrate ER (IMDUR) 60 mg tablet TAKE 1 TABLET EVERY MORNING   ? lactobacillus combination no.4 (SENIOR PROBIOTIC) 15 billion cell cap Take 1 Cap by mouth daily.   ?  latanoprost (XALATAN) 0.005 % ophthalmic solution    ? metoprolol XL (TOPROL XL) 25 mg extended release tablet TAKE 1 TABLET EVERY DAY   ? neomycin/polymyxin/HC (CORTISPORIN) 3.5-10,000-1 mg-unit/mL-% otic suspension Place 4 drops in affected ear(s) as directed three times daily.   ? omeprazole DR (PRILOSEC) 20 mg capsule TAKE 1 CAPSULE EVERY DAY   ? timolol maleate (TIMOPTIC) 0.5 % ophthalmic drops 1 drop twice daily.   ? tiotropium (SPIRIVA WITH HANDIHALER) 18 mcg capsule for inhaler Place 18 mcg into inhaler and inhale into lungs as directed daily.   ? vitamins, multiple tablet Take 1 Tab by mouth daily.   ? Zinc 50 mg tab Take by mouth.     Total time spent on today's office visit was 30 minutes.  This includes face-to-face in person visit with patient as well as nonface-to-face time including review of the EMR, outside records, labs, radiologic studies, echocardiogram & other cardiovascular studies, formation of treatment plan, after visit summary, future disposition, and lastly on documentation.

## 2020-06-27 ENCOUNTER — Ambulatory Visit: Admit: 2020-06-27 | Discharge: 2020-06-27 | Payer: MEDICARE

## 2020-06-27 ENCOUNTER — Encounter: Admit: 2020-06-27 | Discharge: 2020-06-27 | Payer: MEDICARE

## 2020-06-27 DIAGNOSIS — I10 Essential (primary) hypertension: Secondary | ICD-10-CM

## 2020-06-27 DIAGNOSIS — I2510 Atherosclerotic heart disease of native coronary artery without angina pectoris: Secondary | ICD-10-CM

## 2020-07-04 ENCOUNTER — Encounter: Admit: 2020-07-04 | Discharge: 2020-07-04 | Payer: MEDICARE

## 2020-07-04 NOTE — Telephone Encounter
This patient called requesting results of echocardiogram completed on 06/27/20 at the [facility] hospital. It appears there are some changes from the last echo in 2017. Will route to [facility] for his review and recommendations then follow up with the patient.

## 2020-07-05 ENCOUNTER — Encounter: Admit: 2020-07-05 | Discharge: 2020-07-05 | Payer: MEDICARE

## 2020-07-05 DIAGNOSIS — Z5181 Encounter for therapeutic drug level monitoring: Secondary | ICD-10-CM

## 2020-07-05 DIAGNOSIS — I4819 Other persistent atrial fibrillation: Secondary | ICD-10-CM

## 2020-07-05 MED ORDER — AMIODARONE 400 MG PO TAB
400 mg | ORAL_TABLET | Freq: Every day | ORAL | 3 refills | 42.00000 days | Status: AC
Start: 2020-07-05 — End: ?
  Filled 2020-08-08: qty 30, 30d supply, fill #1

## 2020-07-05 NOTE — Progress Notes
Labs and/or CXR for amiodarone surveillance due per Amiodarone protocol.  Lab requisitions/orders  needed for testing faxed to [facility] hospital.    Discussed new medication and monitoring testing required. Patient verbalizes understanding and agreement of plan of care. All questions answered at this time. Patient has call back number for further questions and concerns.

## 2020-07-15 ENCOUNTER — Encounter: Admit: 2020-07-15 | Discharge: 2020-07-15 | Payer: MEDICARE

## 2020-07-15 DIAGNOSIS — Z5181 Encounter for therapeutic drug level monitoring: Secondary | ICD-10-CM

## 2020-07-15 DIAGNOSIS — I4819 Other persistent atrial fibrillation: Secondary | ICD-10-CM

## 2020-07-18 ENCOUNTER — Encounter: Admit: 2020-07-18 | Discharge: 2020-07-18 | Payer: MEDICARE

## 2020-07-18 MED ORDER — ELIQUIS 2.5 MG PO TAB
2.5 mg | ORAL_TABLET | Freq: Two times a day (BID) | ORAL | 3 refills | Status: AC
Start: 2020-07-18 — End: ?

## 2020-07-22 ENCOUNTER — Encounter: Admit: 2020-07-22 | Discharge: 2020-07-22 | Payer: MEDICARE

## 2020-07-23 ENCOUNTER — Encounter: Admit: 2020-07-23 | Discharge: 2020-07-23 | Payer: MEDICARE

## 2020-07-25 ENCOUNTER — Encounter: Admit: 2020-07-25 | Discharge: 2020-07-25 | Payer: MEDICARE

## 2020-07-25 MED ORDER — APIXABAN 2.5 MG PO TAB
5 mg | Freq: Two times a day (BID) | ORAL | 0 refills | Status: AC
Start: 2020-07-25 — End: ?

## 2020-07-25 NOTE — Telephone Encounter
Spoke to pt re: f/u as below and pt agreeable to f/u with Dr. Danella Maiers [facility]) in [place] 5/31 at 11:15 for further management of her AF. She confirmed her amio was dc'd during recent hospitalization and Eliquis dose increased. Med list updated accordingly. Sent msg to [place] scheduling to add pt to schedule. No further needs identified at this time.    ----- Message -----  From: Lauralee Evener, RN  Sent: 07/25/2020  11:36 AM CDT  To: Caryl Pina, RN  Subject: RE: Work In for 5/31                             I think it would be fine to put her on 5/31 but probably have her come at 11:15.    ----- Message -----  From: Caryl Pina, RN  Sent: 07/25/2020  11:05 AM CDT  To: Lauralee Evener, RN  Subject: FW: Work In for 5/31                             Do you think we could add her on 5/31 at 11:30?    ----- Message -----  From: Doy Mince, RN  Sent: 07/25/2020  10:19 AM CDT  To: Sallee Provencal, Cvm Nurse [place]  Subject: RE: Work In for 5/31                             Sending to [place] nurses    ----- Message -----  From: Sallee Provencal  Sent: 07/25/2020  10:18 AM CDT  To: Cvm Nurse [facility]  Subject: Work In for 5/31                                 Good Morning,     This pt is wanting to get in to see SDO in [place] before her appt on 6/23. She was seen in ED a couple of days ago, and they recommend that she is seen sooner than 6/23. Is there anywhere we could work her in.     Thank you.

## 2020-07-30 ENCOUNTER — Encounter: Admit: 2020-07-30 | Discharge: 2020-07-30 | Payer: MEDICARE

## 2020-08-06 ENCOUNTER — Encounter: Admit: 2020-08-06 | Discharge: 2020-08-06 | Payer: MEDICARE

## 2020-08-06 ENCOUNTER — Inpatient Hospital Stay: Admit: 2020-08-06 | Payer: MEDICARE

## 2020-08-06 ENCOUNTER — Inpatient Hospital Stay: Admit: 2020-08-06 | Discharge: 2020-08-06 | Payer: MEDICARE

## 2020-08-06 DIAGNOSIS — I10 Essential (primary) hypertension: Secondary | ICD-10-CM

## 2020-08-06 DIAGNOSIS — I4892 Unspecified atrial flutter: Secondary | ICD-10-CM

## 2020-08-06 DIAGNOSIS — J449 Chronic obstructive pulmonary disease, unspecified: Secondary | ICD-10-CM

## 2020-08-06 DIAGNOSIS — I2510 Atherosclerotic heart disease of native coronary artery without angina pectoris: Secondary | ICD-10-CM

## 2020-08-06 DIAGNOSIS — R079 Chest pain, unspecified: Secondary | ICD-10-CM

## 2020-08-06 DIAGNOSIS — N390 Urinary tract infection, site not specified: Secondary | ICD-10-CM

## 2020-08-06 DIAGNOSIS — G459 Transient cerebral ischemic attack, unspecified: Secondary | ICD-10-CM

## 2020-08-06 DIAGNOSIS — I25721 Atherosclerosis of autologous artery coronary artery bypass graft(s) with angina pectoris with documented spasm: Secondary | ICD-10-CM

## 2020-08-06 DIAGNOSIS — E785 Hyperlipidemia, unspecified: Secondary | ICD-10-CM

## 2020-08-06 DIAGNOSIS — I4819 Other persistent atrial fibrillation: Secondary | ICD-10-CM

## 2020-08-06 DIAGNOSIS — Z9289 Personal history of other medical treatment: Secondary | ICD-10-CM

## 2020-08-06 LAB — COMPREHENSIVE METABOLIC PANEL
ALBUMIN: 4 g/dL (ref 3.5–5.0)
ALK PHOSPHATASE: 84 U/L (ref 25–110)
ALT: 17 U/L (ref 7–56)
ANION GAP: 11 (ref 3–12)
AST: 43 U/L — ABNORMAL HIGH (ref 7–40)
BLD UREA NITROGEN: 18 mg/dL (ref 7–25)
CALCIUM: 9.4 mg/dL (ref 8.5–10.6)
CHLORIDE: 104 MMOL/L (ref 98–110)
CO2: 25 MMOL/L (ref 21–30)
CREATININE: 1 mg/dL — ABNORMAL HIGH (ref 0.4–1.00)
EGFR: 54 mL/min — ABNORMAL LOW (ref >60)
GLUCOSE,PANEL: 132 mg/dL — ABNORMAL HIGH (ref 70–100)
SODIUM: 140 MMOL/L (ref 137–147)
TOTAL BILIRUBIN: 0.6 mg/dL (ref 0.3–1.2)
TOTAL PROTEIN: 6.6 g/dL (ref 6.0–8.0)

## 2020-08-06 LAB — CBC AND DIFF
ABSOLUTE BASO COUNT: 0 K/UL (ref 0–0.20)
ABSOLUTE EOS COUNT: 0.1 K/UL (ref 0–0.45)
ABSOLUTE LYMPH COUNT: 2.2 K/UL (ref 1.0–4.8)
ABSOLUTE MONO COUNT: 0.6 K/UL (ref 0–0.80)
ABSOLUTE NEUTROPHIL: 3.6 K/UL (ref 1.8–7.0)
BASOPHILS %: 1 % (ref 0–2)
EOSINOPHILS %: 2 % (ref 0–5)
HEMATOCRIT: 45 % — ABNORMAL HIGH (ref 36–45)
LYMPHOCYTES %: 33 % (ref 24–44)
MCH: 33 pg (ref 26–34)
MCHC: 35 g/dL (ref 32.0–36.0)
MONOCYTES %: 9 % (ref 4–12)
MPV: 8.1 FL (ref 7–11)
NEUTROPHILS %: 55 % (ref 41–77)
PLATELET COUNT: 309 K/UL (ref 150–400)
RDW: 14 % (ref 11–15)
WBC COUNT: 6.7 K/UL (ref 4.5–11.0)

## 2020-08-06 LAB — PROTIME INR (PT)
INR: 1.3 g/dL — ABNORMAL HIGH (ref 0.8–1.2)
PROTIME: 14 s — ABNORMAL HIGH (ref 9.5–14.2)

## 2020-08-06 LAB — PHOSPHORUS: PHOSPHORUS: 3.8 mg/dL — ABNORMAL LOW (ref >40)

## 2020-08-06 LAB — PTT (APTT): PTT: 32 s (ref 24.0–36.5)

## 2020-08-06 LAB — MAGNESIUM: MAGNESIUM: 2 mg/dL — ABNORMAL HIGH (ref 1.6–2.6)

## 2020-08-06 LAB — BNP (B-TYPE NATRIURETIC PEPTI): BNP: 134 pg/mL — ABNORMAL HIGH (ref 0–100)

## 2020-08-06 MED ORDER — APIXABAN 5 MG PO TAB
5 mg | Freq: Two times a day (BID) | ORAL | 0 refills | Status: AC
Start: 2020-08-06 — End: ?
  Administered 2020-08-07 – 2020-08-08 (×4): 5 mg via ORAL

## 2020-08-06 MED ORDER — SENNOSIDES-DOCUSATE SODIUM 8.6-50 MG PO TAB
1 | Freq: Every day | ORAL | 0 refills | Status: AC | PRN
Start: 2020-08-06 — End: ?

## 2020-08-06 MED ORDER — CHOLECALCIFEROL (VITAMIN D3) 25 MCG (1,000 UNIT) PO TAB
1000 [IU] | Freq: Every day | ORAL | 0 refills | Status: AC
Start: 2020-08-06 — End: ?
  Administered 2020-08-07 – 2020-08-08 (×2): 1000 [IU] via ORAL

## 2020-08-06 MED ORDER — POLYETHYLENE GLYCOL 3350 17 GRAM PO PWPK
1 | Freq: Every day | ORAL | 0 refills | Status: DC | PRN
Start: 2020-08-06 — End: 2020-08-06

## 2020-08-06 MED ORDER — ISOSORBIDE MONONITRATE 60 MG PO TB24
60 mg | Freq: Every day | ORAL | 0 refills | Status: AC
Start: 2020-08-06 — End: ?
  Administered 2020-08-07 – 2020-08-08 (×2): 60 mg via ORAL

## 2020-08-06 MED ORDER — HYDROCHLOROTHIAZIDE 25 MG PO TAB
12.5 mg | Freq: Every morning | ORAL | 0 refills | Status: AC
Start: 2020-08-06 — End: ?
  Administered 2020-08-07 – 2020-08-08 (×2): 12.5 mg via ORAL

## 2020-08-06 MED ORDER — POLYETHYLENE GLYCOL 3350 17 GRAM PO PWPK
1 | Freq: Every day | ORAL | 0 refills | Status: AC
Start: 2020-08-06 — End: ?
  Administered 2020-08-08: 14:00:00 17 g via ORAL

## 2020-08-06 MED ORDER — ASPIRIN 81 MG PO TBEC
81 mg | Freq: Every day | ORAL | 0 refills | Status: AC
Start: 2020-08-06 — End: ?
  Administered 2020-08-07 – 2020-08-08 (×2): 81 mg via ORAL

## 2020-08-06 MED ORDER — METOPROLOL TARTRATE 50 MG PO TAB
50 mg | Freq: Two times a day (BID) | ORAL | 0 refills | Status: AC
Start: 2020-08-06 — End: ?
  Administered 2020-08-07 – 2020-08-08 (×4): 50 mg via ORAL

## 2020-08-06 MED ORDER — FLUTICASONE FUROATE-VILANTEROL 100-25 MCG/DOSE IN DSDV
1 | Freq: Every day | RESPIRATORY_TRACT | 0 refills | Status: AC
Start: 2020-08-06 — End: ?
  Administered 2020-08-07: 14:00:00 1 via RESPIRATORY_TRACT

## 2020-08-06 MED ORDER — ATORVASTATIN 20 MG PO TAB
20 mg | Freq: Every day | ORAL | 0 refills | Status: AC
Start: 2020-08-06 — End: ?
  Administered 2020-08-07 – 2020-08-08 (×2): 20 mg via ORAL

## 2020-08-06 MED ORDER — TIOTROPIUM BROMIDE 2.5 MCG/ACTUATION IN MIST
2 | Freq: Every day | RESPIRATORY_TRACT | 0 refills | Status: AC
Start: 2020-08-06 — End: ?
  Administered 2020-08-07: 14:00:00 2 via RESPIRATORY_TRACT

## 2020-08-06 MED ORDER — MELATONIN 5 MG PO TAB
5 mg | Freq: Every evening | ORAL | 0 refills | Status: AC | PRN
Start: 2020-08-06 — End: ?

## 2020-08-06 MED ORDER — LATANOPROST 0.005 % OP DROP
1 [drp] | Freq: Every evening | OPHTHALMIC | 0 refills | Status: AC
Start: 2020-08-06 — End: ?
  Administered 2020-08-07: 02:00:00 1 [drp] via OPHTHALMIC

## 2020-08-06 MED ORDER — HYDROXYZINE HCL 25 MG PO TAB
25 mg | ORAL | 0 refills | Status: AC
Start: 2020-08-06 — End: ?
  Administered 2020-08-07: 02:00:00 25 mg via ORAL

## 2020-08-06 NOTE — H&P (View-Only)
Admission History and Physical Examination      Name:  Jillian Rose                                             MRN:  [id number]   Admission Date:  08/06/2020                     Assessment/Plan:    Principal Problem:    Atrial flutter [facility])    87 year old female with history of HTN, CAD, and new onset atrial fibrillation/flutter (on apixaban) who presents from cardiology clinic for management of atrial arrhythmia.    #Atrial Fibrillation/Flutter  #Atrial arrhythmia  - follows with Dr. Andria Meuse in [place]  - seen 5/31 due to doing poorly since cardioversion.  EKG showed probable flutter  - TSH 5/seventeen 3.92  - Echocardiogram 06/27/2020: Normal LV size and systolic function with EF 55%, normal RV size and function, mild biatrial enlargement, mild AI, mild to moderate MR, moderate to severe TR, normal estimated PA systolic pressure  - Irregular supraventricular rhythm on telemetry on admission with a rate in the 80s  >Consult EP  >Continue PTA apixaban 5 mg twice daily-patient does not meet criteria for dose reduction at this time  >Rate control with PTA metoprolol tartrate 50 mg twice daily  >Monitor on telemetry  >Check electrolytes  >Goal K >4, Mg >2  >EKG on admission    #CAD s/p CABG x2 (LIMA-LAD, SVG-OM-2)  - NSTEMI in 2014-LHC: 60% left main, 99% proximal LAD, 60 to 70% mid circumflex.  Normal dominant RCA  - 08/2018 lipid profile: Total 150, HDL 34, LDL 65, triglycerides 253  >Continue PTA aspirin 81 mg, atorvastatin 20 mg  >Check lipid profile    #HTN  - Patient reports good blood pressure control at home  - Blood pressure 134/72 on admission  >Continue PTA HCTZ 12.5 mg, Imdur 60 mg, and metoprolol as above    #COPD  - not on home O2  - Never smoker  > Continue PTA inhalers - Breo Ellipta (fluticasone-vilanterol), Spiriva Respimat (tiotropium)    FEN: No IVF, Electrolytes reviewed, Cardiac diet  PT/OT: consulted  DVT PPx: Patient on apixaban  Code Status: DNAR-FI (discussed 5/31)  Disposition: Admit to medicine, Med 3 (2071)    Patient seen and discussed with Dr. Orson Ape.    Caralee Ates, MD   Internal Medicine, PGY-1  Available on Voalte  __________________________________________________________________________________  Primary Care Physician: Nicole Cella  Verified    Chief Complaint:  Atrial fibrillation  History of Present Illness: ANAJAH Rose is a 87 y.o. female 87 year old female with history of HTN, CAD s/p CABG, COPD, and new onset atrial fibrillation/flutter (on apixaban) who presents from cardiology clinic for management of atrial arrhythmia.  She follows with Dr. Andria Meuse with cardiology in [place].  She was seen in clinic 4/14 for new onset atrial fibrillation which was initially noticed by home health nurse for medical insurance check and found irregular rhythm.  EKG confirmed that it was atrial fibrillation with a rate in the 80s to 90s.  She was started on apixaban 2.5 mg twice daily at that time and started on amiodarone 5/4.  She underwent cardioversion 5/12.  She was awoken 5/16 with bad palpitations and evaluated in the emergency department.  She was seen by Dr. [name] Medici who recommended  increasing her apixaban dose as she does not meet the criteria for dose reduction.  He also recommended discontinuing amiodarone as she has been subtherapeutic anticoagulation and conversion to sinus rhythm with risk stroke.  He recommended 3 weeks of therapeutic anticoagulation followed by electrocardioversion.  However, she was seen by Dr. Andria Meuse 5/31 for ongoing fatigue, chest pressure, palpitations and was in what appeared to be atrial flutter in clinic.  She was transferred to [facility] for EP evaluation and consideration of further management.    On evaluation, she is pleasant and a good historian.  She said she was in overall pretty good health prior to all of this.  She can feel palpitations frequently.  She also reports fairly constant feeling of chest pressure that is not related to exertion and does not radiate.  She denies shortness of breath or diaphoresis.  She says she did feel pretty good for about a day following discharge from the cardioversion but fairly quickly went back into the arrhythmia.  She has never smoked, drinks maybe 5 glasses of wine a year she estimates, does not use recreational/illicit drugs.  Since being on anticoagulation she denies any signs/symptoms of bleeding.  She denies hematemesis, hematuria, hematochezia, and melena.    Medical History:   Diagnosis Date   ? CAD (coronary artery disease)    ? Chest pain    ? HTN (hypertension)    ? TIA (transient ischemic attack)    ? UTI (lower urinary tract infection)      No past surgical history on file.  Family history reviewed; non-contributory  Social History     Socioeconomic History   ? Marital status: Single   Tobacco Use   ? Smoking status: Never Smoker   ? Smokeless tobacco: Never Used   Substance and Sexual Activity   ? Alcohol use: No   ? Drug use: No                   Immunizations (includes history and patient reported):   Immunization History   Administered Date(s) Administered   ? COVID-19 [facility]) recombinant protein vacc, 0.5 mL (PF) 01/17/2020           Allergies:  Propofol and Levofloxacin    Medications:  Medications Prior to Admission   Medication Sig   ? apixaban (ELIQUIS) 5 mg tablet Take 5 mg by mouth twice daily.   ? aspirin EC 81 mg tablet Take 81 mg by mouth daily.   ? atorvastatin (LIPITOR) 20 mg tablet TAKE 1 TABLET EVERY DAY   ? cholecalciferol (vitamin D3) (VITAMIN D3) 1,000 units tablet Take 1,000 Units by mouth daily.   ? fluticasone/salmeterol (ADVAIR DISKUS) 250/50 mcg inhalation disk Inhale 1 puff by mouth into the lungs every 12 hours.   ? hydroCHLOROthiazide 12.5 mg tablet TAKE 1 TABLET EVERY MORNING   ? hydrOXYzine HCL (ATARAX) 25 mg tablet Take 1 tablet by mouth every 48 hours.   ? isosorbide mononitrate ER (IMDUR) 60 mg tablet TAKE 1 TABLET EVERY MORNING   ? lactobacillus combination no.4 15 billion cell cap Take 1 Cap by mouth daily.   ? latanoprost (XALATAN) 0.005 % ophthalmic solution Apply 1 drop to both eyes at bedtime daily.   ? metoprolol tartrate (LOPRESSOR) 50 mg tablet Take 50 mg by mouth twice daily.   ? PAPAYA ENZYME PO Take  by mouth as Needed.   ? tiotropium bromide (SPIRIVA) 18 mcg capsule for inhaler Place 18 mcg into inhaler and  inhale into lungs as directed daily.   ? vitamins, multiple tablet Take 1 Tab by mouth daily.     Review of Systems:  14 point review of systems performed and negative except for:  Constitutional: positive for fatigue  Cardiovascular: positive for chest pressure/discomfort, palpitations, irregular heart beats  Gastrointestinal: positive for Occasional heartburn managed well with papaya    Physical Exam:  Vital Signs: Last Filed In 24 Hours Vital Signs: 24 Hour Range   BP: 134/72 (05/31 1559)  Temp: 36.2 ?C (97.2 ?F) (05/31 1559)  Pulse: 87 (05/31 1608)  Respirations: 18 PER MINUTE (05/31 1559)  SpO2: 97 % (05/31 1559)  O2 Percent: 100 % (05/31 1112)  O2 Device: None (Room air) (05/31 1112)  Height: 154.9 cm (5' 0.98) (05/31 1559) BP: (128-134)/(68-76)   Temp:  [36.2 ?C (97.2 ?F)]   Pulse:  [70-87]   Respirations:  [18 PER MINUTE]   SpO2:  [97 %-100 %]   O2 Percent:  [100 %]   O2 Device: None (Room air)          General appearance:  alert and cooperative  Head:  Normocephalic, without obvious abnormality, atraumatic  Eyes:  conjunctivae/corneas clear. PERRL, EOM's intact.  Throat:  Lips, mucosa, and tongue normal. Teeth and gums normal  Lungs:  clear to auscultation bilaterally  Heart:  irregularly irregular rhythm, no murmurs  Abdomen:  soft, non-tender. Bowel sounds normal. No masses,  no organomegaly  Extremities:  extremities normal, atraumatic, no cyanosis or edema  Neurologic:  Grossly normal  Peripheral pulses:  2+ and symmetric    Lab/Radiology/Other Diagnostic Tests:  Pertinent labs reviewed Pertinent radiology reviewed.     Nutrition: Dietitian Documentation                                              Wound: Wound Documentation              Caralee Ates, MD  Pager

## 2020-08-06 NOTE — Assessment & Plan Note
Blood pressure seems to be reasonably controlled on the current medical program.

## 2020-08-06 NOTE — Assessment & Plan Note
She seems to have had an adverse reaction to propofol earlier this month, used for outpatient cardioversion.

## 2020-08-06 NOTE — Assessment & Plan Note
Lab Results   Component Value Date    CHOL 150 08/19/2018    TRIG 253 (H) 08/19/2018    HDL 34 (L) 08/19/2018    LDL 65 08/19/2018    VLDL 51 (H) 08/19/2018    CHOLHDLC 4 08/19/2018      LDL treated to goal.

## 2020-08-06 NOTE — Assessment & Plan Note
Left ventricular systolic function was normal on a recent echocardiogram.  She has not had recent stress testing and this may be necessary as a part of her evaluation for certain antiarrhythmic medications.  I had planned on pursuing a stress test after she was in sinus rhythm but we may need to consider a regadenoson thallium stress test.

## 2020-08-06 NOTE — Assessment & Plan Note
We are planning acute hospitalization for management of her highly symptomatic atrial arrhythmias.  I would anticipate EP consultation regarding the feasibility of flutter ablation.  I would also anticipate some recommendations regarding alternatives to amiodarone if the drug seems to have failed in control of this patient's atrial arrhythmias.  The patient clearly felt better while in sinus rhythm for a few days after cardioversion earlier in the month.  I do think that it is important to continue to pursue a rhythm control strategy in this situation.  I do not believe that rate control is going to help the patient.

## 2020-08-06 NOTE — Progress Notes
Date of Service: 08/06/2020    Jillian Rose is a 87 y.o. female.       HPI     Jillian Rose was in the [place] clinic today for follow-up regarding persistent atrial fibrillation/flutter.  She was accompanied by her daughter.  Jillian Rose is doing poorly and has a lot of exertional breathlessness and fatigue related to her recurrent atrial arrhythmia.  Her recent story is a little complicated.    Back in April she was diagnosed with new onset atrial fibrillation, having presented to Dr. Alona Bene with a couple of months of fatigue and exercise intolerance.  She was started on Eliquis and I saw her in clinic a few days later.  We decided to start amiodarone and to schedule cardioversion a month later if she failed to convert on the antiarrhythmic medication.  She came in about 3 weeks ago for the cardioversion and we were successful in getting her back into sinus rhythm but she had an odd response to the propofol with a lot of myotonic jerking and prolonged fatigue afterwards, such that she had to spend the night in the hospital after the procedure.    After she got home from the cardioversion she says that she did feel a lot better for several days.  She was able to go to church and get out to work in her garden and said that she actually felt back to normal.  But then, on May 17, she was awakened with a sense of palpitation at night that persisted and took her to the emergency department following morning.  She was apparently back in atrial fibrillation or flutter at that time and admitted.  She was seen in consultation by Dr. [name] Medici.    He was concerned that her Eliquis dose was subtherapeutic and increased from 2.5 to 5 mg twice daily.  He  discontinued amiodarone due to concern that he might cause her to be converted back to sinus rhythm on what was considered to be subtherapeutic anticoagulation.  Metoprolol dosage was increased.  She has now been off amiodarone for the past week.  She says that she is back to feeling the way she did over the winter months.  She has not been able to get out to work in the yard and she has a lot of fatigue and some exertional breathlessness again.  She has not had any bleeding problems on the increased dose of Eliquis.    Her EKG today shows probable atrial flutter.  I have suggested hospitalization for more effective management of her very symptomatic atrial arrhythmias.  Given the appearance of today's EKG I would question whether a flutter ablation may be a therapeutic option for her.  I would also consider her to have failed amiodarone, given the fact that she had recurrent atrial arrhythmias despite about 6 weeks on the medication.  Although she is only been off amiodarone for a week there may potentially be other antiarrhythmic medications that could be considered if necessary.  Some of these would need to be started while she is in the hospital.       Vitals:    08/06/20 1112 08/06/20 1129   BP: 128/68 132/76   BP Source: Arm, Left Upper Arm, Right Upper   Pulse: 70    SpO2: 100%    O2 Percent: 100 %    O2 Device: None (Room air)    PainSc: Seven    Weight: 76.1 kg (167 lb 12.8 oz)    Height: 154.9  cm (5' 1)      Body mass index is 31.71 kg/m?Marland Kitchen     Past Medical History  Patient Active Problem List    Diagnosis Date Noted   ? History of adverse reaction to anesthesia 08/06/2020   ? Persistent atrial fibrillation (HCC) 06/20/2020   ? Left leg pain 06/18/2016     06/2016 - Left leg swelling and pain.  Venous Duplex negative.  Compression stockings recommended.     ? Chest pain 10/02/2013   ? HLD (hyperlipidemia) 10/02/2013   ? Nephrolithiasis 06/08/2013     03/2013 - Spontaneous passage of kidney stone while in [place]     ? Venous insufficiency 01/23/2013   ? Near syncope 10/30/2012   ? Dyslipidemia 08/04/2012     2014 - Atorvastatin started after CABG and NSTEMI    07/20/12 - Total 155, LDL 101, HDL 44, TG 119.  Atorvastatin doubled to 20 mg/day on 5/29.     ? CAD (coronary artery disease) 08/03/2012     03/2012- NSTEMI complicating pneumonia.  EF 40-45% by echo.  60% left main, 99% proximal LAD, 60-70% mid-circumflex.  Normal dominant RCA.  EF 40%, distal anterior severe hypokinesis.  03/22/2012 - CABG X 2 (LIMA-LAD, SVG-OM-2).  [facility], [place], [place]., Dr. Barrett Henle.  06/26/15 Echo+Doppler: EF 65%. Mild Abnormal Septal Motion. Mild concentric LVH. Mild LA dilation. Mild AV regurgitation. PASP 37 mmHg.     ? HTN (hypertension) 08/03/2012   ? TIA (transient ischemic attack) 08/03/2012     08/01/2008     ? Asthma 08/03/2012   ? Sleep apnea 08/03/2012   ? GERD (gastroesophageal reflux disease) 08/03/2012         Review of Systems   Constitutional: Positive for malaise/fatigue.   HENT: Positive for congestion, hoarse voice and stridor.    Eyes: Negative.    Cardiovascular: Positive for chest pain, claudication, dyspnea on exertion, irregular heartbeat, leg swelling and orthopnea.   Respiratory: Positive for cough, shortness of breath and wheezing.    Endocrine: Negative.    Hematologic/Lymphatic: Negative.    Skin: Negative.    Musculoskeletal: Negative.    Gastrointestinal: Positive for constipation.   Genitourinary: Negative.    Neurological: Positive for headaches, loss of balance, numbness, paresthesias and weakness.   Psychiatric/Behavioral: Negative.    Allergic/Immunologic: Negative.        Physical Exam    Physical Exam   General Appearance: no distress   Skin: warm, no ulcers or xanthomas   Digits and Nails: no cyanosis or clubbing   Eyes: conjunctivae and lids normal, pupils are equal and round   Teeth/Gums/Palate: dentition unremarkable, no lesions   Lips & Oral Mucosa: no pallor or cyanosis   Neck Veins: normal JVP , neck veins are not distended   Thyroid: no nodules, masses, tenderness or enlargement   Chest Inspection: chest is normal in appearance   Respiratory Effort: breathing comfortably, no respiratory distress   Auscultation/Percussion: lungs clear to auscultation, no rales or rhonchi, no wheezing   PMI: PMI not enlarged or displaced   Cardiac Rhythm: irregular rhythm and normal rate   Cardiac Auscultation: S1, S2 normal, no rub, no gallop   Murmurs: no murmur   Peripheral Circulation: normal peripheral circulation   Carotid Arteries: normal carotid upstroke bilaterally, no bruits   Radial Arteries: normal symmetric radial pulses   Abdominal Aorta: no abdominal aortic bruit   Pedal Pulses: normal symmetric pedal pulses   Lower Extremity Edema: no lower extremity edema  Abdominal Exam: soft, non-tender, no masses, bowel sounds normal   Liver & Spleen: no organomegaly   Gait & Station: walks without assistance   Muscle Strength: normal muscle tone   Orientation: oriented to time, place and person   Affect & Mood: appropriate and sustained affect   Language and Memory: patient responsive and seems to comprehend information   Neurologic Exam: neurological assessment grossly intact   Other: moves all extremities      Cardiovascular Studies    EKG:  Atrial flutter (probably typical) with variable AV conduction, ventricular rate 68.    Cardiovascular Health Factors  Vitals BP Readings from Last 3 Encounters:   08/06/20 132/76   06/27/20 132/74   06/20/20 132/78     Wt Readings from Last 3 Encounters:   08/06/20 76.1 kg (167 lb 12.8 oz)   06/27/20 77 kg (169 lb 12.1 oz)   06/20/20 77.1 kg (170 lb)     BMI Readings from Last 3 Encounters:   08/06/20 31.71 kg/m?   06/27/20 32.07 kg/m?   06/20/20 31.09 kg/m?      Smoking Social History     Tobacco Use   Smoking Status Never Smoker   Smokeless Tobacco Never Used      Lipid Profile Cholesterol   Date Value Ref Range Status   08/19/2018 150  Final     HDL   Date Value Ref Range Status   08/19/2018 34 (L) >40 Final     LDL   Date Value Ref Range Status   08/19/2018 65  Final     Triglycerides   Date Value Ref Range Status   08/19/2018 253 (H) <150 Final      Blood Sugar No results found for: HGBA1C  Glucose   Date Value Ref Range Status   07/23/2020 108 (H) 70 - 105 Final   07/18/2020 103  Final   01/09/2020 128 (H) 70 - 105 Final          Problems Addressed Today  Encounter Diagnoses   Name Primary?   ? Persistent atrial fibrillation (HCC)    ? Coronary artery disease involving native coronary artery of native heart without angina pectoris    ? Primary hypertension    ? Hyperlipidemia, unspecified hyperlipidemia type    ? History of adverse reaction to anesthesia        Assessment and Plan       Persistent atrial fibrillation (HCC)  We are planning acute hospitalization for management of her highly symptomatic atrial arrhythmias.  I would anticipate EP consultation regarding the feasibility of flutter ablation.  I would also anticipate some recommendations regarding alternatives to amiodarone if the drug seems to have failed in control of this patient's atrial arrhythmias.  The patient clearly felt better while in sinus rhythm for a few days after cardioversion earlier in the month.  I do think that it is important to continue to pursue a rhythm control strategy in this situation.  I do not believe that rate control is going to help the patient.    CAD (coronary artery disease)  Left ventricular systolic function was normal on a recent echocardiogram.  She has not had recent stress testing and this may be necessary as a part of her evaluation for certain antiarrhythmic medications.  I had planned on pursuing a stress test after she was in sinus rhythm but we may need to consider a regadenoson thallium stress test.    HTN (hypertension)  Blood pressure seems to  be reasonably controlled on the current medical program.    HLD (hyperlipidemia)  Lab Results   Component Value Date    CHOL 150 08/19/2018    TRIG 253 (H) 08/19/2018    HDL 34 (L) 08/19/2018    LDL 65 08/19/2018    VLDL 51 (H) 08/19/2018    CHOLHDLC 4 08/19/2018      LDL treated to goal.    History of adverse reaction to anesthesia  She seems to have had an adverse reaction to propofol earlier this month, used for outpatient cardioversion.      Current Medications (including today's revisions)  ? apixaban (ELIQUIS) 5 mg tablet Take 5 mg by mouth twice daily.   ? aspirin EC 81 mg tablet Take 81 mg by mouth daily.   ? atorvastatin (LIPITOR) 20 mg tablet TAKE 1 TABLET EVERY DAY   ? cholecalciferol (vitamin D3) (VITAMIN D3) 1,000 units tablet Take 1,000 Units by mouth daily.   ? fluticasone/salmeterol (ADVAIR DISKUS) 250/50 mcg inhalation disk Inhale 1 puff by mouth into the lungs every 12 hours.   ? hydroCHLOROthiazide 12.5 mg tablet TAKE 1 TABLET EVERY MORNING   ? hydrOXYzine HCL (ATARAX) 25 mg tablet Take 1 tablet by mouth every 48 hours.   ? isosorbide mononitrate ER (IMDUR) 60 mg tablet TAKE 1 TABLET EVERY MORNING   ? lactobacillus combination no.4 15 billion cell cap Take 1 Cap by mouth daily.   ? latanoprost (XALATAN) 0.005 % ophthalmic solution Apply 1 drop to both eyes at bedtime daily.   ? metoprolol tartrate (LOPRESSOR) 50 mg tablet Take 50 mg by mouth twice daily.   ? PAPAYA ENZYME PO Take  by mouth as Needed.   ? tiotropium bromide (SPIRIVA) 18 mcg capsule for inhaler Place 18 mcg into inhaler and inhale into lungs as directed daily.   ? vitamins, multiple tablet Take 1 Tab by mouth daily.     Total time spent on today's office visit was 45 minutes.  This includes face-to-face in person visit with patient as well as nonface-to-face time including review of the EMR, outside records, labs, radiologic studies, echocardiogram & other cardiovascular studies, formation of treatment plan, after visit summary, future disposition, and lastly on documentation.

## 2020-08-07 ENCOUNTER — Inpatient Hospital Stay: Admit: 2020-08-07 | Discharge: 2020-08-07 | Payer: MEDICARE

## 2020-08-07 ENCOUNTER — Encounter: Admit: 2020-08-07 | Discharge: 2020-08-07 | Payer: MEDICARE

## 2020-08-07 DIAGNOSIS — G459 Transient cerebral ischemic attack, unspecified: Secondary | ICD-10-CM

## 2020-08-07 DIAGNOSIS — I2510 Atherosclerotic heart disease of native coronary artery without angina pectoris: Secondary | ICD-10-CM

## 2020-08-07 DIAGNOSIS — I4892 Unspecified atrial flutter: Secondary | ICD-10-CM

## 2020-08-07 DIAGNOSIS — R079 Chest pain, unspecified: Secondary | ICD-10-CM

## 2020-08-07 DIAGNOSIS — N390 Urinary tract infection, site not specified: Secondary | ICD-10-CM

## 2020-08-07 DIAGNOSIS — I10 Essential (primary) hypertension: Secondary | ICD-10-CM

## 2020-08-07 MED ORDER — PROPOFOL 10 MG/ML IV EMUL 50 ML (INFUSION)(AM)(OR)
INTRAVENOUS | 0 refills | Status: DC
Start: 2020-08-07 — End: 2020-08-07
  Administered 2020-08-07: 17:00:00 50 ug/kg/min via INTRAVENOUS

## 2020-08-07 MED ORDER — PROPOFOL INJ 10 MG/ML IV VIAL
INTRAVENOUS | 0 refills | Status: DC
Start: 2020-08-07 — End: 2020-08-07
  Administered 2020-08-07: 17:00:00 30 mg via INTRAVENOUS

## 2020-08-07 MED ORDER — PHENYLEPHRINE HCL IN 0.9% NACL 1 MG/10 ML (100 MCG/ML) IV SYRG
INTRAVENOUS | 0 refills | Status: DC
Start: 2020-08-07 — End: 2020-08-07
  Administered 2020-08-07 (×3): 100 ug via INTRAVENOUS

## 2020-08-07 MED ORDER — LIDOCAINE (PF) 20 MG/ML (2 %) IJ SOLN
INTRAVENOUS | 0 refills | Status: DC
Start: 2020-08-07 — End: 2020-08-07
  Administered 2020-08-07: 17:00:00 60 mg via INTRAVENOUS

## 2020-08-07 MED ADMIN — FENTANYL CITRATE (PF) 50 MCG/ML IJ SOLN [3037]: 25 ug | INTRAVENOUS | @ 19:00:00 | Stop: 2020-08-07 | NDC 00409909425

## 2020-08-07 MED ADMIN — HEPARIN (PORCINE) 1,000 UNIT/ML IJ SOLN [10176]: 15 mL | @ 19:00:00 | Stop: 2020-08-07 | NDC 63323054001

## 2020-08-07 MED ADMIN — SODIUM CHLORIDE 0.9 % IV SOLP [27838]: 15 mL | @ 19:00:00 | Stop: 2020-08-07 | NDC 00264999900

## 2020-08-07 MED ADMIN — MAGNESIUM SULFATE IN D5W 1 GRAM/100 ML IV PGBK [166578]: 1 g | INTRAVENOUS | @ 16:00:00 | Stop: 2020-08-07 | NDC 00409672723

## 2020-08-07 MED ADMIN — MIDAZOLAM 1 MG/ML IJ SOLN [10607]: 0.5 mg | INTRAVENOUS | @ 19:00:00 | Stop: 2020-08-07 | NDC 00409230516

## 2020-08-07 MED ADMIN — AMIODARONE 200 MG PO TAB [9066]: 400 mg | ORAL | @ 23:00:00 | NDC 00904699361

## 2020-08-07 MED ADMIN — HEPARIN (PORCINE) 1,000 UNIT/ML IJ SOLN [10176]: 525 mL | @ 19:00:00 | Stop: 2020-08-07 | NDC 63323054001

## 2020-08-07 MED ADMIN — MIDAZOLAM 1 MG/ML IJ SOLN [10607]: 0.5 mg | INTRAVENOUS | @ 18:00:00 | Stop: 2020-08-07 | NDC 00409230516

## 2020-08-07 MED ADMIN — SODIUM CHLORIDE 0.9 % IV SOLP [27838]: 1000.000 mL | INTRAVENOUS | @ 17:00:00 | Stop: 2020-08-07 | NDC 00264999900

## 2020-08-07 MED ADMIN — BUPIVACAINE (PF) 0.25 % (2.5 MG/ML) IJ SOLN [87866]: 10 mL | @ 19:00:00 | Stop: 2020-08-07 | NDC 00409155918

## 2020-08-07 MED ADMIN — SODIUM CHLORIDE 0.9 % IV SOLP [27838]: 525 mL | @ 19:00:00 | Stop: 2020-08-07 | NDC 00264999900

## 2020-08-07 NOTE — Anesthesia Post-Procedure Evaluation
Post-Anesthesia Evaluation    Name: Jillian Rose      MRN: [id number]     DOB: July 06, 1931     Age: 87 y.o.     Sex: female   __________________________________________________________________________     Procedure Information     Anesthesia Start Date/Time: 08/07/20 1152    Procedures:       INTRACARDIAC CATHETER ABLATION WITH COMPREHENSIVE ELECTROPHYSIOLOGIC EVALUATION - TYPICAL FLUTTER (N/A )      TRANSESOPHAGEAL ECHOCARDIOGRAM DURING INTERVENTION (N/A ) - prior to ablation    Location: [facility] / [facility]    Providers: Lorain Childes, MD; Cath, Physician          Post-Anesthesia Vitals  BP: 130/84 (06/01 1250)  Pulse: 78 (06/01 1250)  Respirations: 19 PER MINUTE (06/01 1250)  SpO2: 100 % (06/01 1250)  SpO2 Pulse: 79 (06/01 1250)  O2 Device: Non-rebreather (06/01 1250)   Vitals Value Taken Time   BP 130/84 08/07/20 1250   Temp     Pulse 78 08/07/20 1250   Respirations 19 PER MINUTE 08/07/20 1250   SpO2 100 % 08/07/20 1250   O2 Device Non-rebreather 08/07/20 1250   ABP     ART BP           Post Anesthesia Evaluation Note    Evaluation location: other  Patient participation: recovered; patient participated in evaluation  Level of consciousness: sleepy but conscious  Pain management: adequate    Hydration: normovolemia  Temperature: 36.0C - 38.4C  Airway patency: adequate    Perioperative Events       Post-op nausea and vomiting: no PONV    Postoperative Status  Cardiovascular status: hemodynamically stable  Respiratory status: spontaneous ventilation and supplemental oxygen  Follow-up needed: none  Additional comments: Pt following commands and answering questions appropriately.  Care handed over to Hillery Aldo, RN.  She stated she felt comfortable assuming care under the direction of Dr. Derrell Lolling         Perioperative Events  There were no known complications for this encounter.

## 2020-08-07 NOTE — Anesthesia Pre-Procedure Evaluation
Anesthesia Pre-Procedure Evaluation    Name: Jillian Rose      MRN: [id number]     DOB: [birth year]     Age: 87 y.o.     Sex: female   _________________________________________________________________________     Procedure Info:   Procedure Information     Date/Time: 08/07/20 1130    Procedures:       INTRACARDIAC CATHETER ABLATION WITH COMPREHENSIVE ELECTROPHYSIOLOGIC EVALUATION - TYPICAL FLUTTER (N/A )      TRANSESOPHAGEAL ECHOCARDIOGRAM DURING INTERVENTION (N/A ) - prior to ablation    Location: [facility] / [facility]    Providers: Lorain Childes, MD; Cath, Physician          Physical Assessment  Vital Signs (last filed in past 24 hours):  BP: 126/68 (06/01 0821)  Temp: 36.3 ?C (97.4 ?F) (06/01 6578)  Pulse: 86 (06/01 1059)  Respirations: 18 PER MINUTE (06/01 0906)  SpO2: 97 % (06/01 0906)  O2 Device: None (Room air) (06/01 0906)  Height: 154.9 cm (5' 0.98) (05/31 1559)  Weight: 74.4 kg (164 lb) (05/31 1559)      Patient History   Allergies   Allergen Reactions   ? Propofol DYSTONIA   ? Levofloxacin NAUSEA AND VOMITING        Current Medications    Medication Directions   apixaban (ELIQUIS) 5 mg tablet Take 5 mg by mouth twice daily.   aspirin EC 81 mg tablet Take 81 mg by mouth daily.   atorvastatin (LIPITOR) 20 mg tablet TAKE 1 TABLET EVERY DAY   cholecalciferol (vitamin D3) (VITAMIN D3) 1,000 units tablet Take 1,000 Units by mouth daily.   fluticasone/salmeterol (ADVAIR DISKUS) 250/50 mcg inhalation disk Inhale 1 puff by mouth into the lungs every 12 hours.   hydroCHLOROthiazide 12.5 mg tablet TAKE 1 TABLET EVERY MORNING   hydrOXYzine HCL (ATARAX) 25 mg tablet Take 1 tablet by mouth every 48 hours.   isosorbide mononitrate ER (IMDUR) 60 mg tablet TAKE 1 TABLET EVERY MORNING   lactobacillus combination no.4 15 billion cell cap Take 1 Cap by mouth daily.   latanoprost (XALATAN) 0.005 % ophthalmic solution Apply 1 drop to both eyes at bedtime daily.   metoprolol tartrate (LOPRESSOR) 50 mg tablet Take 50 mg by mouth twice daily.   PAPAYA ENZYME PO Take  by mouth as Needed.   tiotropium bromide (SPIRIVA) 18 mcg capsule for inhaler Place 18 mcg into inhaler and inhale into lungs as directed daily.   vitamins, multiple tablet Take 1 Tab by mouth daily.         Review of Systems/Medical History        PONV Screening: Female gender and Non-smoker  No history of anesthetic complications        Pulmonary           Asthma    COPD        Sleep apnea          Interventions: CPAP; compliant      Cardiovascular       Recent diagnostic studies:          echocardiogram          06/27/2020  Normal LV size and systolic function with an estimated ejection fraction of 55%   Normal RV size and function  Visually, both atria appear to be mildly enlarged.  Mild aortic insufficiency is present  Mild to moderate mitral regurgitation noted  Moderate to severe TR is present  The estimated PA systolic  pressure is normal         Beta Blocker therapy: Yes      Beta blockers within 24 hours: Yes      Hypertension,       Coronary artery disease      Coronary artery bypass graft      Dysrhythmias      Hyperlipidemia      GI/Hepatic/Renal           GERD, well controlled      No hx of liver disease     No renal disease      Neuro/Psych       No seizures      Hx TIA      No CVA      Musculoskeletal         No neck pain      Endocrine/Other       No diabetes      No hypothyroidism   Physical Exam    Airway Findings      Mallampati: III      TM distance: <3 FB      Neck ROM: limited      Mouth opening: good      Airway patency: adequate    Cardiovascular Findings:       Rhythm: irregular      Rate: normal    Pulmonary Findings:       Breath sounds clear to auscultation.    Abdominal Findings:       Obese    Neurological Findings:       Alert and oriented x 3    Constitutional findings:       No acute distress       Diagnostic Tests  Hematology:   Lab Results   Component Value Date    HGB 15.5 08/07/2020    HCT 45.4 08/07/2020    PLTCT 277 08/07/2020 WBC 6.5 08/07/2020    NEUT 46 08/07/2020    ANC 3.02 08/07/2020    ALC 2.59 08/07/2020    MONA 10 08/07/2020    AMC 0.64 08/07/2020    EOSA 3 08/07/2020    ABC 0.05 08/07/2020    MCV 94.6 08/07/2020    MCH 32.2 08/07/2020    MCHC 34.1 08/07/2020    MPV 7.5 08/07/2020    RDW 14.2 08/07/2020         General Chemistry:   Lab Results   Component Value Date    NA 140 08/07/2020    K 4.3 08/07/2020    CL 102 08/07/2020    CO2 29 08/07/2020    GAP 9 08/07/2020    BUN 17 08/07/2020    CR 1.03 08/07/2020    GLU 93 08/07/2020    CA 8.9 08/07/2020    ALBUMIN 4.0 08/06/2020    MG 1.8 08/07/2020    TOTBILI 0.6 08/06/2020    PO4 3.8 08/06/2020      Coagulation:   Lab Results   Component Value Date    PT 14.8 08/06/2020    PTT 32.4 08/06/2020    INR 1.3 08/06/2020         Anesthesia Plan    ASA score: 3   Plan: MAC  Induction method: intravenous  NPO status: acceptable      Informed Consent  Anesthetic plan and risks discussed with patient.  Use of blood products discussed with patient  Blood Consent: consented      Plan discussed with: anesthesiologist and CRNA.  Comments: (Pt and family understands that anesthesia dept will provide anesthesia for the TEE portion and the ablation sedation will be directed by Dr. Derrell Lolling.    Pt has propofol allergy listed but after discussion it sounds like it was one occasion after cardioversion and the pt experienced shivering.  Other sedation type medications were discussed as an alternative and they agree that propofol is the best option for this procedure and we (anesthesia team) will adapt as indicated with her response    Plan is monitored anesthesia care with general anesthesia as back up  Risks discussed, questions answered, and pt wishes to proceed   Risks discussed: aspiration, awareness, CVA, MI, Death  Pt has rescinded her DNR for the procedure )

## 2020-08-07 NOTE — Progress Notes
RT Adult Assessment Note    NAME:Roberta YUVAL NOLET             MRN: [id number]             DOB:[birth year]          AGE: 87 y.o.  ADMISSION DATE: 08/06/2020             DAYS ADMITTED: LOS: 0 days    RT Treatment Plan:  Protocol Plan: Medications  Tiotropium: MDI Q Day  Breo (Home regimen only): MDI Qday (Home advair)    Protocol Plan: Procedures  PAP: Place a nursing order for "IS Q1h While Awake" for any of Lung Expansion indicators  CPAP/BiPAP: CPAP  SpO2: Continuous (Document SpO2 result Qshift)  Comment: Home CPAP/ref CPOX    Additional Comments:  Impressions of the patient: Patient is resting on RA< no respiratory distress  Intervention(s)/outcome(s): See above care plan  Patient education that was completed: Spoke about replacing her home Advair with Breo  Recommendations to the care team: Encourage IS    Vital Signs:  Pulse: 81  RR: 20 PER MINUTE  SpO2: 96 %  O2 Device: None (Room air)  Liter Flow:    O2%:    Breath Sounds: Clear (Implies normal);Decreased  Respiratory Effort: Non-Labored

## 2020-08-08 ENCOUNTER — Encounter: Admit: 2020-08-08 | Discharge: 2020-08-08 | Payer: MEDICARE

## 2020-08-08 MED ADMIN — AMIODARONE 200 MG PO TAB [9066]: 400 mg | ORAL | @ 14:00:00 | Stop: 2020-08-08 | NDC 00904699361

## 2020-08-08 MED ADMIN — MAGNESIUM SULFATE IN D5W 1 GRAM/100 ML IV PGBK [166578]: 1 g | INTRAVENOUS | @ 14:00:00 | Stop: 2020-08-08 | NDC 00338170940

## 2020-08-09 ENCOUNTER — Encounter: Admit: 2020-08-09 | Discharge: 2020-08-09 | Payer: MEDICARE

## 2020-08-09 DIAGNOSIS — I10 Essential (primary) hypertension: Secondary | ICD-10-CM

## 2020-08-09 DIAGNOSIS — I2510 Atherosclerotic heart disease of native coronary artery without angina pectoris: Secondary | ICD-10-CM

## 2020-08-09 DIAGNOSIS — R079 Chest pain, unspecified: Secondary | ICD-10-CM

## 2020-08-09 DIAGNOSIS — G459 Transient cerebral ischemic attack, unspecified: Secondary | ICD-10-CM

## 2020-08-09 DIAGNOSIS — N390 Urinary tract infection, site not specified: Secondary | ICD-10-CM

## 2020-08-29 ENCOUNTER — Encounter: Admit: 2020-08-29 | Discharge: 2020-08-29 | Payer: MEDICARE

## 2020-08-29 DIAGNOSIS — I483 Typical atrial flutter: Secondary | ICD-10-CM

## 2020-08-29 DIAGNOSIS — R079 Chest pain, unspecified: Secondary | ICD-10-CM

## 2020-08-29 DIAGNOSIS — I2510 Atherosclerotic heart disease of native coronary artery without angina pectoris: Secondary | ICD-10-CM

## 2020-08-29 DIAGNOSIS — G459 Transient cerebral ischemic attack, unspecified: Secondary | ICD-10-CM

## 2020-08-29 DIAGNOSIS — N390 Urinary tract infection, site not specified: Secondary | ICD-10-CM

## 2020-08-29 DIAGNOSIS — I10 Essential (primary) hypertension: Secondary | ICD-10-CM

## 2020-08-29 NOTE — Assessment & Plan Note
I am not sure what the intention was with regard to amiodarone dosing.  There is also some mention about the possibility of discontinuing Eliquis a month out from the flutter ablation.  I will contact [name] and Dr. Derrell Lolling to try to clarify the recommendations here.    It is remarkable how much better she feels when she is maintained in sinus rhythm.  I would like to at least reduce her amiodarone dosage from 400 to 200 mg/day but perhaps will be able to get her off the medication entirely.

## 2020-08-29 NOTE — Assessment & Plan Note
No angina or other symptoms to suggest any progression of her coronary disease.

## 2020-08-29 NOTE — Progress Notes
Date of Service: 08/29/2020    Jillian Rose is a 87 y.o. female.       HPI     Jillian Rose was in the [place] clinic today with her daughter.  She had gone down to [place] at the beginning of the month for a flutter ablation and was discharged on amiodarone as well as Eliquis.  She has felt great since then and has had no further problems with breathlessness or profound fatigue, the main symptoms associated with her atrial arrhythmia.    She denies problems with chest discomfort.  She always has a little bit of exertional breathlessness but she is anxious to get back out to work in her garden.    She has had no bleeding complications nor has she had any symptoms to suggest thromboembolic complications.         Vitals:    08/29/20 1108 08/29/20 1129   BP: 122/76 126/70   BP Source: Arm, Left Upper Arm, Right Upper   Pulse: 68    SpO2: 97%    O2 Percent: 97 %    O2 Device: None (Room air)    PainSc: Zero    Weight: 77.3 kg (170 lb 6.4 oz)    Height: 157.5 cm (5' 2)      Body mass index is 31.17 kg/m?Marland Kitchen     Past Medical History  Patient Active Problem List    Diagnosis Date Noted   ? History of adverse reaction to anesthesia 08/06/2020   ? Atrial flutter (HCC) 08/06/2020   ? Persistent atrial fibrillation (HCC) 06/20/2020   ? Left leg pain 06/18/2016     06/2016 - Left leg swelling and pain.  Venous Duplex negative.  Compression stockings recommended.     ? Chest pain 10/02/2013   ? HLD (hyperlipidemia) 10/02/2013   ? Nephrolithiasis 06/08/2013     03/2013 - Spontaneous passage of kidney stone while in [place]     ? Venous insufficiency 01/23/2013   ? Near syncope 10/30/2012   ? Dyslipidemia 08/04/2012     2014 - Atorvastatin started after CABG and NSTEMI    07/20/12 - Total 155, LDL 101, HDL 44, TG 119.  Atorvastatin doubled to 20 mg/day on 5/29.     ? CAD (coronary artery disease) 08/03/2012     03/2012- NSTEMI complicating pneumonia.  EF 40-45% by echo.  60% left main, 99% proximal LAD, 60-70% mid-circumflex.  Normal dominant RCA.  EF 40%, distal anterior severe hypokinesis.  03/22/2012 - CABG X 2 (LIMA-LAD, SVG-OM-2).  [facility], [place], [place]., Dr. Barrett Henle.  06/26/15 Echo+Doppler: EF 65%. Mild Abnormal Septal Motion. Mild concentric LVH. Mild LA dilation. Mild AV regurgitation. PASP 37 mmHg.     ? HTN (hypertension) 08/03/2012   ? TIA (transient ischemic attack) 08/03/2012     08/01/2008     ? Asthma 08/03/2012   ? Sleep apnea 08/03/2012   ? GERD (gastroesophageal reflux disease) 08/03/2012         Review of Systems   Constitutional: Negative.   HENT: Positive for tinnitus.    Eyes: Negative.    Cardiovascular: Positive for claudication, dyspnea on exertion and leg swelling.   Respiratory: Positive for shortness of breath.    Endocrine: Negative.    Hematologic/Lymphatic: Negative.    Skin: Negative.    Musculoskeletal: Positive for neck pain.   Gastrointestinal: Negative.    Genitourinary: Negative.    Neurological: Positive for paresthesias.   Psychiatric/Behavioral: Negative.    Allergic/Immunologic: Negative.  Physical Exam    Physical Exam   General Appearance: no distress   Skin: warm, no ulcers or xanthomas   Digits and Nails: no cyanosis or clubbing   Eyes: conjunctivae and lids normal, pupils are equal and round   Teeth/Gums/Palate: dentition unremarkable, no lesions   Lips & Oral Mucosa: no pallor or cyanosis   Neck Veins: normal JVP , neck veins are not distended   Thyroid: no nodules, masses, tenderness or enlargement   Chest Inspection: chest is normal in appearance   Respiratory Effort: breathing comfortably, no respiratory distress   Auscultation/Percussion: lungs clear to auscultation, no rales or rhonchi, no wheezing   PMI: PMI not enlarged or displaced   Cardiac Rhythm: regular rhythm and normal rate   Cardiac Auscultation: S1, S2 normal, no rub, no gallop   Murmurs: no murmur   Peripheral Circulation: normal peripheral circulation   Carotid Arteries: normal carotid upstroke bilaterally, no bruits   Radial Arteries: normal symmetric radial pulses   Abdominal Aorta: no abdominal aortic bruit   Pedal Pulses: normal symmetric pedal pulses   Lower Extremity Edema: no lower extremity edema   Abdominal Exam: soft, non-tender, no masses, bowel sounds normal   Liver & Spleen: no organomegaly   Gait & Station: walks without assistance   Muscle Strength: normal muscle tone   Orientation: oriented to time, place and person   Affect & Mood: appropriate and sustained affect   Language and Memory: patient responsive and seems to comprehend information   Neurologic Exam: neurological assessment grossly intact   Other: moves all extremities      Cardiovascular Studies    EKG:SR, rate 55.      Cardiovascular Health Factors  Vitals BP Readings from Last 3 Encounters:   08/29/20 126/70   08/08/20 125/61   08/07/20 130/84     Wt Readings from Last 3 Encounters:   08/29/20 77.3 kg (170 lb 6.4 oz)   08/08/20 76 kg (167 lb 9.6 oz)   08/07/20 74.4 kg (164 lb)     BMI Readings from Last 3 Encounters:   08/29/20 31.17 kg/m?   08/08/20 31.67 kg/m?   08/07/20 30.99 kg/m?      Smoking Social History     Tobacco Use   Smoking Status Never Smoker   Smokeless Tobacco Never Used      Lipid Profile Cholesterol   Date Value Ref Range Status   08/06/2020 144 <200 MG/DL Final     HDL   Date Value Ref Range Status   08/06/2020 39 (L) >40 MG/DL Final     LDL   Date Value Ref Range Status   08/06/2020 79 <100 mg/dL Final     Triglycerides   Date Value Ref Range Status   08/06/2020 190 (H) <150 MG/DL Final      Blood Sugar No results found for: HGBA1C  Glucose   Date Value Ref Range Status   08/08/2020 92 70 - 100 MG/DL Final   16/12/9602 93 70 - 100 MG/DL Final   54/11/8117 147 (H) 70 - 100 MG/DL Final          Problems Addressed Today  Encounter Diagnoses   Name Primary?   ? Typical atrial flutter (HCC)    ? Coronary artery disease involving native coronary artery of native heart without angina pectoris        Assessment and Plan       Atrial flutter (HCC)  I am not sure what the intention was with  regard to amiodarone dosing.  There is also some mention about the possibility of discontinuing Eliquis a month out from the flutter ablation.  I will contact [name] and Dr. Derrell Lolling to try to clarify the recommendations here.    It is remarkable how much better she feels when she is maintained in sinus rhythm.  I would like to at least reduce her amiodarone dosage from 400 to 200 mg/day but perhaps will be able to get her off the medication entirely.        CAD (coronary artery disease)  No angina or other symptoms to suggest any progression of her coronary disease.      Current Medications (including today's revisions)  ? amiodarone (PACERONE) 400 mg tablet Take one tablet by mouth daily. Take with food.   ? apixaban (ELIQUIS) 5 mg tablet Take 5 mg by mouth twice daily.   ? aspirin EC 81 mg tablet Take 81 mg by mouth daily.   ? atorvastatin (LIPITOR) 20 mg tablet TAKE 1 TABLET EVERY DAY   ? cholecalciferol (vitamin D3) (VITAMIN D3) 1,000 units tablet Take 1,000 Units by mouth daily.   ? fluticasone/salmeterol (ADVAIR DISKUS) 250/50 mcg inhalation disk Inhale 1 puff by mouth into the lungs every 12 hours.   ? hydroCHLOROthiazide 12.5 mg tablet TAKE 1 TABLET EVERY MORNING   ? hydrOXYzine HCL (ATARAX) 25 mg tablet Take 1 tablet by mouth every 48 hours.   ? isosorbide mononitrate ER (IMDUR) 60 mg tablet TAKE 1 TABLET EVERY MORNING   ? lactobacillus combination no.4 15 billion cell cap Take 1 Cap by mouth daily.   ? latanoprost (XALATAN) 0.005 % ophthalmic solution Apply 1 drop to both eyes at bedtime daily.   ? metoprolol tartrate (LOPRESSOR) 50 mg tablet Take 50 mg by mouth twice daily.   ? PAPAYA ENZYME PO Take  by mouth as Needed.   ? tiotropium bromide (SPIRIVA) 18 mcg capsule for inhaler Place 18 mcg into inhaler and inhale into lungs as directed daily.   ? vitamins, multiple tablet Take 1 Tab by mouth daily.     Total time spent on today's office visit was 30 minutes.  This includes face-to-face in person visit with patient as well as nonface-to-face time including review of the EMR, outside records, labs, radiologic studies, echocardiogram & other cardiovascular studies, formation of treatment plan, after visit summary, future disposition, and lastly on documentation.

## 2020-09-03 ENCOUNTER — Encounter: Admit: 2020-09-03 | Discharge: 2020-09-03 | Payer: MEDICARE

## 2020-09-03 DIAGNOSIS — I4819 Other persistent atrial fibrillation: Secondary | ICD-10-CM

## 2020-09-03 DIAGNOSIS — I483 Typical atrial flutter: Secondary | ICD-10-CM

## 2020-09-03 NOTE — Telephone Encounter
-----   Message from Jillian Sarah, MD sent at 09/03/2020  9:14 AM CDT -----  Can you please review Dr. Jacinto Halim recommendations below and forward those instructions/orders to Tessy-stop amio now, 2-week Zio after 2 weeks off amiodarone, and then maybe stop Eliquis if the Marvin looks OK.  Thanks!  ----- Message -----  From: Lorain Childes, MD  Sent: 08/31/2020   3:06 PM CDT  To: Roosvelt Maser, APRN-NP, Jillian Sarah, MD    I am sorry about this Brett Canales.  You are right it is hard to tell what our recommendations were post atrial flutter on this.  I reviewed chart and could be confusing.     My procedure note says no AAD, but there is not a clear plan.      We can discontinue the Amiodarone.      Lets repeat a 2 week zio patch (my typical standard after an atrial flutter ablation to make sure no afib is present).  If the 2 week zio patch is negative, we can d/c the [facility].     Thank you Charna Elizabeth   ----- Message -----  From: Jillian Sarah, MD  Sent: 08/29/2020  11:49 AM CDT  To: Roosvelt Maser, APRN-NP, #    I saw her in clinic today and she's doing well--in sinus rhythm.  I couldn't tell about the recommendations regarding duration of Eliquis and amiodarone post-flutter ablation.  What were you guys thinking there?  Thanks!

## 2020-09-17 ENCOUNTER — Encounter: Admit: 2020-09-17 | Discharge: 2020-09-17 | Payer: MEDICARE

## 2020-09-17 ENCOUNTER — Ambulatory Visit: Admit: 2020-09-17 | Discharge: 2020-09-17 | Payer: MEDICARE

## 2020-09-17 DIAGNOSIS — I4819 Other persistent atrial fibrillation: Secondary | ICD-10-CM

## 2020-09-17 DIAGNOSIS — I483 Typical atrial flutter: Secondary | ICD-10-CM

## 2020-11-07 ENCOUNTER — Encounter: Admit: 2020-11-07 | Discharge: 2020-11-07 | Payer: MEDICARE

## 2020-11-07 DIAGNOSIS — E785 Hyperlipidemia, unspecified: Secondary | ICD-10-CM

## 2020-11-07 DIAGNOSIS — I10 Essential (primary) hypertension: Secondary | ICD-10-CM

## 2020-11-07 DIAGNOSIS — I2510 Atherosclerotic heart disease of native coronary artery without angina pectoris: Secondary | ICD-10-CM

## 2020-11-07 DIAGNOSIS — N390 Urinary tract infection, site not specified: Secondary | ICD-10-CM

## 2020-11-07 DIAGNOSIS — I483 Typical atrial flutter: Secondary | ICD-10-CM

## 2020-11-07 DIAGNOSIS — G459 Transient cerebral ischemic attack, unspecified: Secondary | ICD-10-CM

## 2020-11-07 DIAGNOSIS — R079 Chest pain, unspecified: Secondary | ICD-10-CM

## 2020-11-07 NOTE — Telephone Encounter
-----   Message from Lorain Childes, MD sent at 11/07/2020  8:05 AM CDT -----  The monitor shows no atrial flutter, however, there is a 49 second episode of what looks like SVT but could be Afib.  She has risk factors for afib and a very high chads vasc so I am concerned about coming off of the anticoagulation.  If she wants to come off of the blood thinner post atrial flutter we may need to consider an implantable loop recorder to make sure that she is not having silent atrial fibrillation.     We can have her come in to see me and I can discuss next steps (ie. ILR vs staying on [facility] chronically).     Rigo

## 2020-11-07 NOTE — Progress Notes
Date of Service: 11/07/2020    Jillian Rose is a 87 y.o. female.       HPI     Jillian Rose was in the [place] clinic today for follow-up regarding her history of atrial arrhythmias and coronary disease.  She is really done well over the past couple of months but I am concerned about some complications of oral anticoagulation.  She has had 3 episodes of spontaneous epistaxis.  More worrisome, she stumbled over a curb and fell, striking her head a few weeks ago.  She had a pretty good scalp hematoma but brain imaging did not demonstrate any evidence of intracranial bleeding.    She has had absolutely no palpitations or other symptoms to suggest recurrent atrial arrhythmias after her successful flutter ablation.    Her energy level is good and she is not having problems with chest discomfort or breathlessness.  She denies any TIA or stroke symptoms.  She has had no trouble with peripheral edema or other manifestations of fluid retention.         Vitals:    11/07/20 1304   BP: 134/72   BP Source: Arm, Left Upper   Pulse: 63   SpO2: 98%   O2 Device: None (Room air)   PainSc: Zero   Weight: 77 kg (169 lb 12.8 oz)   Height: 157.5 cm (5' 2)     Body mass index is 31.06 kg/m?Marland Kitchen     Past Medical History  Patient Active Problem List    Diagnosis Date Noted   ? History of adverse reaction to anesthesia 08/06/2020   ? Atrial flutter (HCC) 08/06/2020   ? Persistent atrial fibrillation (HCC) 06/20/2020   ? Left leg pain 06/18/2016     06/2016 - Left leg swelling and pain.  Venous Duplex negative.  Compression stockings recommended.     ? Chest pain 10/02/2013   ? HLD (hyperlipidemia) 10/02/2013   ? Nephrolithiasis 06/08/2013     03/2013 - Spontaneous passage of kidney stone while in [place]     ? Venous insufficiency 01/23/2013   ? Near syncope 10/30/2012   ? Dyslipidemia 08/04/2012     2014 - Atorvastatin started after CABG and NSTEMI    07/20/12 - Total 155, LDL 101, HDL 44, TG 119.  Atorvastatin doubled to 20 mg/day on 5/29.     ? CAD (coronary artery disease) 08/03/2012     03/2012- NSTEMI complicating pneumonia.  EF 40-45% by echo.  60% left main, 99% proximal LAD, 60-70% mid-circumflex.  Normal dominant RCA.  EF 40%, distal anterior severe hypokinesis.  03/22/2012 - CABG X 2 (LIMA-LAD, SVG-OM-2).  [facility], [place], [place]., Dr. Barrett Henle.  06/26/15 Echo+Doppler: EF 65%. Mild Abnormal Septal Motion. Mild concentric LVH. Mild LA dilation. Mild AV regurgitation. PASP 37 mmHg.     ? HTN (hypertension) 08/03/2012   ? TIA (transient ischemic attack) 08/03/2012     08/01/2008     ? Asthma 08/03/2012   ? Sleep apnea 08/03/2012   ? GERD (gastroesophageal reflux disease) 08/03/2012         Review of Systems   Constitutional: Negative.   HENT: Positive for nosebleeds.    Eyes: Negative.    Cardiovascular: Negative.    Respiratory: Negative.    Endocrine: Negative.    Hematologic/Lymphatic: Negative.    Skin: Negative.    Musculoskeletal: Negative.    Gastrointestinal: Negative.    Genitourinary: Negative.    Neurological: Negative.    Psychiatric/Behavioral: Negative.  Allergic/Immunologic: Negative.        Physical Exam    Physical Exam   General Appearance: no distress   Skin: warm, no ulcers or xanthomas   Digits and Nails: no cyanosis or clubbing   Eyes: conjunctivae and lids normal, pupils are equal and round   Teeth/Gums/Palate: dentition unremarkable, no lesions   Lips & Oral Mucosa: no pallor or cyanosis   Neck Veins: normal JVP , neck veins are not distended   Thyroid: no nodules, masses, tenderness or enlargement   Chest Inspection: chest is normal in appearance   Respiratory Effort: breathing comfortably, no respiratory distress   Auscultation/Percussion: lungs clear to auscultation, no rales or rhonchi, no wheezing   PMI: PMI not enlarged or displaced   Cardiac Rhythm: regular rhythm and normal rate   Cardiac Auscultation: S1, S2 normal, no rub, no gallop   Murmurs: no murmur   Peripheral Circulation: normal peripheral circulation   Carotid Arteries: normal carotid upstroke bilaterally, no bruits   Radial Arteries: normal symmetric radial pulses   Abdominal Aorta: no abdominal aortic bruit   Pedal Pulses: normal symmetric pedal pulses   Lower Extremity Edema: no lower extremity edema   Abdominal Exam: soft, non-tender, no masses, bowel sounds normal   Liver & Spleen: no organomegaly   Gait & Station: walks without assistance   Muscle Strength: normal muscle tone   Orientation: oriented to time, place and person   Affect & Mood: appropriate and sustained affect   Language and Memory: patient responsive and seems to comprehend information   Neurologic Exam: neurological assessment grossly intact   Other: moves all extremities        Cardiovascular Health Factors  Vitals BP Readings from Last 3 Encounters:   11/07/20 134/72   08/29/20 126/70   08/08/20 125/61     Wt Readings from Last 3 Encounters:   11/07/20 77 kg (169 lb 12.8 oz)   08/29/20 77.3 kg (170 lb 6.4 oz)   08/08/20 76 kg (167 lb 9.6 oz)     BMI Readings from Last 3 Encounters:   11/07/20 31.06 kg/m?   08/29/20 31.17 kg/m?   08/08/20 31.67 kg/m?      Smoking Social History     Tobacco Use   Smoking Status Never Smoker   Smokeless Tobacco Never Used      Lipid Profile Cholesterol   Date Value Ref Range Status   08/06/2020 144 <200 MG/DL Final     HDL   Date Value Ref Range Status   08/06/2020 39 (L) >40 MG/DL Final     LDL   Date Value Ref Range Status   08/06/2020 79 <100 mg/dL Final     Triglycerides   Date Value Ref Range Status   08/06/2020 190 (H) <150 MG/DL Final      Blood Sugar No results found for: HGBA1C  Glucose   Date Value Ref Range Status   08/08/2020 92 70 - 100 MG/DL Final   16/12/9602 93 70 - 100 MG/DL Final   54/11/8117 147 (H) 70 - 100 MG/DL Final          Problems Addressed Today  Encounter Diagnoses   Name Primary?   ? Typical atrial flutter (HCC)    ? Coronary artery disease involving native coronary artery of native heart without angina pectoris    ? Hyperlipidemia, unspecified hyperlipidemia type    ? Primary hypertension        Assessment and Plan  Atrial flutter (HCC)  She's had some bleeding issues and I think that it's time to discontinue oral anti-coagulation.    CAD (coronary artery disease)  No angina or other symptoms to suggest cardiac ischemia.    HLD (hyperlipidemia)  Lab Results   Component Value Date    CHOL 144 08/06/2020    TRIG 190 (H) 08/06/2020    HDL 39 (L) 08/06/2020    LDL 79 08/06/2020    VLDL 38 08/06/2020    NONHDLCHOL 105 08/06/2020    CHOLHDLC 4 08/19/2018      LDL pretty close to goal.    HTN (hypertension)  Her blood pressure looks good on the current medical program.      Current Medications (including today's revisions)  ? aspirin EC 81 mg tablet Take 81 mg by mouth daily.   ? atorvastatin (LIPITOR) 20 mg tablet TAKE 1 TABLET EVERY DAY   ? cholecalciferol (vitamin D3) (VITAMIN D3) 1,000 units tablet Take 1,000 Units by mouth daily.   ? fluticasone/salmeterol (ADVAIR DISKUS) 250/50 mcg inhalation disk Inhale 1 puff by mouth into the lungs every 12 hours.   ? hydroCHLOROthiazide 12.5 mg tablet TAKE 1 TABLET EVERY MORNING   ? hydrOXYzine HCL (ATARAX) 25 mg tablet Take 1 tablet by mouth every 48 hours.   ? isosorbide mononitrate ER (IMDUR) 60 mg tablet TAKE 1 TABLET EVERY MORNING   ? lactobacillus combination no.4 15 billion cell cap Take 1 Cap by mouth daily.   ? latanoprost (XALATAN) 0.005 % ophthalmic solution Apply 1 drop to both eyes at bedtime daily.   ? metoprolol tartrate (LOPRESSOR) 50 mg tablet Take 50 mg by mouth twice daily.   ? PAPAYA ENZYME PO Take  by mouth as Needed.   ? tiotropium bromide (SPIRIVA) 18 mcg capsule for inhaler Place 18 mcg into inhaler and inhale into lungs as directed daily.   ? vitamins, multiple tablet Take 1 Tab by mouth daily.     Total time spent on today's office visit was 30 minutes.  This includes face-to-face in person visit with patient as well as nonface-to-face time including review of the EMR, outside records, labs, radiologic studies, echocardiogram & other cardiovascular studies, formation of treatment plan, after visit summary, future disposition, and lastly on documentation.

## 2020-11-07 NOTE — Assessment & Plan Note
She's had some bleeding issues and I think that it's time to discontinue oral anti-coagulation.

## 2020-11-07 NOTE — Assessment & Plan Note
Her blood pressure looks good on the current medical program.

## 2020-11-07 NOTE — Telephone Encounter
Jillian Rose has an appointment with Dr Barry Dienes this afternoon in the [place] clinic.

## 2020-11-07 NOTE — Assessment & Plan Note
Lab Results   Component Value Date    CHOL 144 08/06/2020    TRIG 190 (H) 08/06/2020    HDL 39 (L) 08/06/2020    LDL 79 08/06/2020    VLDL 38 08/06/2020    NONHDLCHOL 105 08/06/2020    CHOLHDLC 4 08/19/2018      LDL pretty close to goal.

## 2020-11-07 NOTE — Assessment & Plan Note
No angina or other symptoms to suggest cardiac ischemia.

## 2021-04-16 ENCOUNTER — Encounter: Admit: 2021-04-16 | Discharge: 2021-04-16 | Payer: MEDICARE

## 2021-04-16 DIAGNOSIS — E785 Hyperlipidemia, unspecified: Secondary | ICD-10-CM

## 2021-04-16 MED ORDER — HYDROCHLOROTHIAZIDE 12.5 MG PO TAB
ORAL_TABLET | Freq: Every morning | ORAL | 3 refills | 30.00000 days | Status: AC
Start: 2021-04-16 — End: ?

## 2021-04-16 MED ORDER — OMEPRAZOLE 20 MG PO CPDR
ORAL_CAPSULE | Freq: Every day | 3 refills | Status: AC
Start: 2021-04-16 — End: ?

## 2021-04-16 MED ORDER — ATORVASTATIN 20 MG PO TAB
ORAL_TABLET | Freq: Every day | 3 refills | Status: AC
Start: 2021-04-16 — End: ?

## 2021-09-18 ENCOUNTER — Ambulatory Visit: Admit: 2021-09-18 | Discharge: 2021-09-18 | Payer: MEDICARE

## 2021-09-18 ENCOUNTER — Encounter: Admit: 2021-09-18 | Discharge: 2021-09-18 | Payer: MEDICARE

## 2021-09-18 DIAGNOSIS — G459 Transient cerebral ischemic attack, unspecified: Secondary | ICD-10-CM

## 2021-09-26 ENCOUNTER — Encounter: Admit: 2021-09-26 | Discharge: 2021-09-26 | Payer: MEDICARE

## 2021-09-26 NOTE — Telephone Encounter
Patient called to let us know she was recently hospitalized in the [facility] hospital for TIA and elevated blood pressure. While she was there she had an echo and she was recommended by her PCP to follow up with [facility] about valve regurgitation. Previous echo on 06/27/20 shows MR and TR regurgitation. Patient has an appointment scheduled within the next couple of weeks with [facility] in the [place] clinic.Patient not currently having any new or worsening cardiac symptoms and states her blood pressure was under control prior to discharge from the hospital.      Will send to [facility] to review her latest echo to see if he would like to make any recommendations prior to her appointment.

## 2021-10-13 ENCOUNTER — Encounter: Admit: 2021-10-13 | Discharge: 2021-10-13 | Payer: MEDICARE

## 2021-10-16 ENCOUNTER — Encounter: Admit: 2021-10-16 | Discharge: 2021-10-16 | Payer: MEDICARE

## 2021-10-16 DIAGNOSIS — G459 Transient cerebral ischemic attack, unspecified: Secondary | ICD-10-CM

## 2021-10-16 DIAGNOSIS — I25721 Atherosclerosis of autologous artery coronary artery bypass graft(s) with angina pectoris with documented spasm: Secondary | ICD-10-CM

## 2021-10-16 DIAGNOSIS — E785 Hyperlipidemia, unspecified: Secondary | ICD-10-CM

## 2021-10-16 DIAGNOSIS — R079 Chest pain, unspecified: Secondary | ICD-10-CM

## 2021-10-16 DIAGNOSIS — I4819 Other persistent atrial fibrillation: Secondary | ICD-10-CM

## 2021-10-16 DIAGNOSIS — I483 Typical atrial flutter: Secondary | ICD-10-CM

## 2021-10-16 DIAGNOSIS — I10 Essential (primary) hypertension: Secondary | ICD-10-CM

## 2021-10-16 DIAGNOSIS — N390 Urinary tract infection, site not specified: Secondary | ICD-10-CM

## 2021-10-16 DIAGNOSIS — I2510 Atherosclerotic heart disease of native coronary artery without angina pectoris: Secondary | ICD-10-CM

## 2021-10-16 DIAGNOSIS — I639 Cerebral infarction, unspecified: Secondary | ICD-10-CM

## 2021-10-16 NOTE — Patient Instructions
We will be contacting you in the next few days to arrange a LINQ implantable event monitor implant at either our [place] or [facility] locations.  We will verify insurance coverage for your implant. Call us if you have not heard anything about scheduling within a week. You can reach [place] at [phone].     location  [place], [place] [address]    [address]  [place], [place]  [address]    The preferred method of communication for LINQ events or symptom episodes is to use [employer]. This will allow [place] to communicate in the most efficient way. We would recommend you sign up before your implant if possible.    COST                                                                                                                            Once you are scheduled for the implant, we request approval from your insurance company. This process can take up to 3 weeks in some cases. If we have still not received approval the day prior to your implant, we will contact you to reschedule. Please note that even with approval from your insurance company, you may have an out of pocket cost depending on your deductible and how much you have already met. If you are unsure of how much you have paid in, you can contact your insurance company and give them code [phone] for the implantation code. Code for the actual LINQ device: C1 764    We will receive a full summary report every 31 days which will be billed to your insurance (You may have to share of cost for billing of codes [phone] and G2066).  Often, this is similar to your office copay. It is your responsibility to call to check the co-payment amount that will be billed every 31 days.              The financial counselor for the health system that can help figure out costs as well.   Jillian Rose - Development worker, community for [employer] location [phone] another contact would be [place]- [phone]

## 2021-10-16 NOTE — Assessment & Plan Note
Lab Results   Component Value Date    CHOL 144 08/06/2020    TRIG 190 (H) 08/06/2020    HDL 39 (L) 08/06/2020    LDL 79 08/06/2020    VLDL 38 08/06/2020    NONHDLCHOL 105 08/06/2020    CHOLHDLC 4 08/19/2018      LDL looks OK.

## 2021-10-16 NOTE — Progress Notes
Date of Service: 10/16/2021    Jillian Rose is a 88 y.o. female.       HPI     Jillian Rose was in the [place] clinic today with her daughter.  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  The evaluation of cerebrovascular anatomy was pretty unremarkable.  She was on telemetry and no atrial arrhythmias were identified and she has had no symptoms to suggest atrial fibrillation but she was never terribly symptomatic with the rhythm prior to her ablation a year ago.    She is a little depressed and fearful after the stroke.  She like to try to get down to [place] for 1 more winter but is a little bit afraid that she will have more trouble when she gets there.    She is not having problems with exertional dyspnea or angina.  She has not had any syncope or near syncope.  She is concerned that her gait has been a little bit less stable since the stroke.         Vitals:    10/16/21 0916   BP: 116/68   BP Source: Arm, Left Upper   Pulse: 63   SpO2: 97%   O2 Device: None (Room air)   PainSc: Zero   Weight: 79.4 kg (175 lb)   Height: 157.5 cm (5' 2)     Body mass index is 32.01 kg/m?Marland Kitchen     Past Medical History  Patient Active Problem List    Diagnosis Date Noted   ? 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 History of adverse reaction to anesthesia 08/06/2020   ? Atrial flutter (HCC) 08/06/2020   ? Persistent atrial fibrillation (HCC) 06/20/2020   ? Left leg pain 06/18/2016     06/2016 - Left leg swelling and pain.  Venous Duplex negative.  Compression stockings recommended.     ? Chest pain 10/02/2013   ? HLD (hyperlipidemia) 10/02/2013   ? Nephrolithiasis 06/08/2013     03/2013 - Spontaneous passage of kidney stone while in [place]     ? Venous insufficiency 01/23/2013   ? Near syncope 10/30/2012   ? Dyslipidemia 08/04/2012     2014 - Atorvastatin started after CABG and NSTEMI    07/20/12 - Total 155, LDL 101, HDL 44, TG 119.  Atorvastatin doubled to 20 mg/day on 5/29.     ? CAD (coronary artery disease) 08/03/2012     03/2012- NSTEMI complicating pneumonia.  EF 40-45% by echo.  60% left main, 99% proximal LAD, 60-70% mid-circumflex.  Normal dominant RCA.  EF 40%, distal anterior severe hypokinesis.  03/22/2012 - CABG X 2 (LIMA-LAD, SVG-OM-2).  [facility], [place], [place]., Dr. Barrett Henle.  06/26/15 Echo+Doppler: EF 65%. Mild Abnormal Septal Motion. Mild concentric LVH. Mild LA dilation. Mild AV regurgitation. PASP 37 mmHg.     ? HTN (hypertension) 08/03/2012   ? TIA (transient ischemic attack) 08/03/2012     08/01/2008     ? Asthma 08/03/2012   ? Sleep apnea 08/03/2012   ? GERD (gastroesophageal reflux disease) 08/03/2012         Review of Systems   Constitutional: Negative.   HENT: Negative.    Eyes: Negative.    Cardiovascular: Negative.    Respiratory: Negative.    Endocrine: Negative.    Hematologic/Lymphatic: Negative.    Skin: Negative.    Musculoskeletal: Negative.    Gastrointestinal: Negative.  Genitourinary: Negative.    Neurological: Negative.    Psychiatric/Behavioral: Negative.    Allergic/Immunologic: Negative.        Physical Exam    Physical Exam   General Appearance: no distress   Skin: warm, no ulcers or xanthomas   Digits and Nails: no cyanosis or clubbing   Eyes: conjunctivae and lids normal, pupils are equal and round   Teeth/Gums/Palate: dentition unremarkable, no lesions   Lips & Oral Mucosa: no pallor or cyanosis   Neck Veins: normal JVP , neck veins are not distended   Thyroid: no nodules, masses, tenderness or enlargement   Chest Inspection: chest is normal in appearance   Respiratory Effort: breathing comfortably, no respiratory distress   Auscultation/Percussion: lungs clear to auscultation, no rales or rhonchi, no wheezing   PMI: PMI not enlarged or displaced   Cardiac Rhythm: regular rhythm and normal rate   Cardiac Auscultation: S1, S2 normal, no rub, no gallop   Murmurs: no murmur Peripheral Circulation: normal peripheral circulation   Carotid Arteries: normal carotid upstroke bilaterally, no bruits   Radial Arteries: normal symmetric radial pulses   Abdominal Aorta: no abdominal aortic bruit   Pedal Pulses: normal symmetric pedal pulses   Lower Extremity Edema: no lower extremity edema   Abdominal Exam: soft, non-tender, no masses, bowel sounds normal   Liver & Spleen: no organomegaly   Gait & Station: walks without assistance   Muscle Strength: normal muscle tone   Orientation: oriented to time, place and person   Affect & Mood: appropriate and sustained affect   Language and Memory: patient responsive and seems to comprehend information   Neurologic Exam: neurological assessment grossly intact   Other: moves all extremities      Cardiovascular Health Factors  Vitals BP Readings from Last 3 Encounters:   10/16/21 116/68   09/18/21 129/57   11/07/20 134/72     Wt Readings from Last 3 Encounters:   10/16/21 79.4 kg (175 lb)   09/18/21 78.9 kg (174 lb)   11/07/20 77 kg (169 lb 12.8 oz)     BMI Readings from Last 3 Encounters:   10/16/21 32.01 kg/m?   09/18/21 32.88 kg/m?   11/07/20 31.06 kg/m?      Smoking Social History     Tobacco Use   Smoking Status Never   Smokeless Tobacco Never   Vaping Use   ? Vaping Use: Never used      Lipid Profile Cholesterol   Date Value Ref Range Status   08/06/2020 144 <200 MG/DL Final     HDL   Date Value Ref Range Status   08/06/2020 39 (L) >40 MG/DL Final     LDL   Date Value Ref Range Status   08/06/2020 79 <100 mg/dL Final     Triglycerides   Date Value Ref Range Status   08/06/2020 190 (H) <150 MG/DL Final      Blood Sugar No results found for: HGBA1C  Glucose   Date Value Ref Range Status   09/18/2021 93  Final   08/08/2020 92 70 - 100 MG/DL Final   16/12/9602 93 70 - 100 MG/DL Final          Problems Addressed Today  Encounter Diagnoses   Name Primary?   ? 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 Typical atrial flutter (HCC)    ? Persistent atrial fibrillation (HCC)    ? Cerebrovascular accident (CVA),  unspecified mechanism (HCC)    ? Hyperlipidemia, unspecified hyperlipidemia type    ? 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  Her ejection fraction was 65%.  As expected she has moderate diastolic dysfunction but there was no evidence of any sort of cardiac embolic source.    HLD (hyperlipidemia)  Lab Results   Component Value Date    CHOL 144 08/06/2020    TRIG 190 (H) 08/06/2020    HDL 39 (L) 08/06/2020    LDL 79 08/06/2020    VLDL 38 08/06/2020    NONHDLCHOL 105 08/06/2020    CHOLHDLC 4 08/19/2018      LDL looks OK.    HTN (hypertension)  BP looks fine on current meds.    Atrial flutter (HCC)  Although she has done well after her flutter ablation she never was particularly symptomatic and given the nature of her stroke a couple of months ago I really think we need to monitor her rhythm as accurately as possible.  If her insurance will cover the procedure I think an implanted monitor will be the most accurate and an obtrusive means of screening for recurrent atrial arrhythmias.  She will not be a great candidate for oral anticoagulation and if we do find evidence of recurrent arrhythmia I think that we should pursue left atrial appendage occlusion device.  Although she had bypass surgery in [place] in 2015 her TEE a year ago definitely shows an intact appendage.  It was definitely not surgically ligated.      Current Medications (including today's revisions)  ? aspirin 325 mg cap Take 325 mg by mouth daily.   ? atorvastatin (LIPITOR) 20 mg tablet TAKE 1 TABLET EVERY DAY (Patient taking differently: Take two tablets by mouth daily.)   ? cholecalciferol (vitamin D3) (VITAMIN D3) 1,000 units tablet Take one tablet by mouth daily.   ? fluticasone/salmeterol (ADVAIR DISKUS) 250/50 mcg inhalation disk Inhale one puff by mouth into the lungs every 12 hours.   ? hydroCHLOROthiazide 12.5 mg tablet TAKE 1 TABLET EVERY MORNING   ? hydrOXYzine HCL (ATARAX) 25 mg tablet Take one tablet by mouth as Needed.   ? isosorbide mononitrate ER (IMDUR) 60 mg tablet TAKE 1 TABLET EVERY MORNING   ? lactobacillus combination no.4 15 billion cell cap Take 1 Cap by mouth daily.   ? latanoprost (XALATAN) 0.005 % ophthalmic solution Apply one drop to both eyes at bedtime daily.   ? mometasone-formoterol (DULERA) 200-5 mcg/actuation inhalation Inhale  by mouth into the lungs twice daily.   ? omeprazole DR (PRILOSEC) 20 mg capsule TAKE 1 CAPSULE EVERY DAY   ? PAPAYA ENZYME PO Take  by mouth as Needed.   ? sertraline (ZOLOFT) 50 mg tablet Take one tablet by mouth daily.   ? tiotropium bromide (SPIRIVA) 18 mcg capsule for inhaler Place one capsule into inhaler and inhale into lungs as directed daily.   ? vitamins, multiple tablet Take one tablet by mouth daily.     Total time spent on today's office visit was 45 minutes.  This includes face-to-face in person visit with patient as well as nonface-to-face time including review of the EMR, outside records, labs, radiologic studies, echocardiogram & other cardiovascular studies, formation of treatment plan, after visit summary, future disposition,  and lastly on documentation.

## 2021-10-16 NOTE — Assessment & Plan Note
BP looks fine on current meds.

## 2021-10-16 NOTE — Assessment & Plan Note
I reassured her that the echocardiogram she had in July was essentially normal.  Her ejection fraction was 65%.  As expected she has moderate diastolic dysfunction but there was no evidence of any sort of cardiac embolic source.

## 2021-10-27 ENCOUNTER — Encounter: Admit: 2021-10-27 | Discharge: 2021-10-27 | Payer: MEDICARE

## 2021-10-27 MED ORDER — CEPHALEXIN 500 MG PO CAP
ORAL_CAPSULE | 0 refills | Status: AC
Start: 2021-10-27 — End: ?

## 2021-10-27 NOTE — Telephone Encounter
PRE-PROCEDURE INSTRUCTIONS      ARRIVAL TIME  Please report to the Cardiovascular [facility] office at [facility] : 11/11/2021  Report at the following time: 11:00am with Dr. Wallene Huh  If you have further questions please call [phone] and ask to speak with a nurse.    PROCEDURE    Your scheduled procedure: Implantable Cardiac Monitor Implant    DIET AND FLUID INTAKE: You have no restrictions prior to your procedure.      SPECIAL MEDICATIONS INSTRUCTIONS    Do not make any changes to your daily medication and supplementation schedule.    Medication Comments: Please take your antibiotic (Keflex) the morning of your procedure.     Additional Instructions  You will receive a home monitor and be instructed how to use your monitor at the time of your procedure.      ADDITIONAL INFORMATION  Form Completed By: Foye Clock [phone]    Confirmed appointment dates, times and location with patient on 10/27/2021

## 2021-10-30 ENCOUNTER — Encounter: Admit: 2021-10-30 | Discharge: 2021-10-30 | Payer: MEDICARE

## 2021-10-30 ENCOUNTER — Ambulatory Visit: Admit: 2021-10-30 | Discharge: 2021-10-30 | Payer: MEDICARE

## 2021-10-30 DIAGNOSIS — I2510 Atherosclerotic heart disease of native coronary artery without angina pectoris: Secondary | ICD-10-CM

## 2021-10-30 DIAGNOSIS — I10 Essential (primary) hypertension: Secondary | ICD-10-CM

## 2021-10-30 DIAGNOSIS — I483 Typical atrial flutter: Secondary | ICD-10-CM

## 2021-10-30 DIAGNOSIS — E785 Hyperlipidemia, unspecified: Secondary | ICD-10-CM

## 2021-10-30 NOTE — Telephone Encounter
-----   Message from Vanice Sarah, MD sent at 10/30/2021  4:14 PM CDT -----  Regarding: FW: ILR will be denied. Needs 30 day monitor  Was she OK with wearing a monitor?  Thanks.  ----- Message -----  From: Connye Burkitt, RN  Sent: 10/28/2021  11:13 AM CDT  To: Vanice Sarah, MD; Cvm Nurse [place]  Subject: ILR will be denied. Needs 30 day monitor         I got a call from Margit Hanks (precert) and this patient's ILR is going to be denied. She has to have a 30-day monitor prior.    Liborio Nixon said that we would need to do 30-day monitor, cancel ILR implant for 9/5, and then reschedule ILR implant after monitor completed.    [place] nurses- can you take care of? Thanks!

## 2021-10-30 NOTE — Telephone Encounter
Called and spoke to patient regarding upcoming ILR procedure being denied. Patient verbalized understanding and has no further questions at this time.

## 2021-10-30 NOTE — Progress Notes
To our valued patient,     We have enrolled your heart monitor and requested it to be mailed to your home.  You should receive this within 2-3 business days. Please wear the monitor for 30 days. When you have completed the study, please remove the device, and mail it back to the company. Please call [employer] Customer Service at [phone] or [email or web address] with questions about placement, troubleshooting, and insurance coverage. You can reach the ambulatory heart monitor team at [phone].        ? Please write your NAME, PHYSICIAN, START DATE/TIME on the diary.    ? Prep skin by shaving and ensuring there is no lotion on the chest.  Gently abrade for clear ecgs.  ? Showering is okay, but best to shower with back to the water.    ? Please use the diary for symptoms - specific date and times and what you are feeling.  ? Please return the device in the mailer with diary promptly after completing the study.        Your Heart Rhythm Management Team  Cardiovascular Medicine Department at [facility]              Ambulatory (External) Cardiac Monitor Enrollment Record     Placement Location: Home Enrollment  Clinic Location: [facility] [place]  Vendor: [employer] ([employer])  Mobile Cardiac Telemetry (MCOT/MCT)?: Yes  Duration of Monitor (in days): 30  Monitor Diagnosis: -- (a flutter)  No data recordedOrdering Provider: Vanice Sarah, MD  AMB Monitor Serial Number: home  No data recorded    Start Time and Date: 10/30/21 5:30 PM   Patient Name: Jillian Rose  DOB: [birth year]  MRN: [id number]  Sex: female  Mobile Phone Number: [phone] (mobile)  Home Phone Number: [phone]  Patient Address: [address] [address]  Insurance Coverage: Francine Graven CHOICE PPO  Insurance ID: [id number]  Insurance Group #: [id number]  Insurance Subscriber: Jillian Rose,Jillian Rose  Implanted Cardiac Device Information: No results found for: EPDEVTYP      Patient instructed to contact company phone number on the monitor box with questions regarding billing, placement, troubleshooting.     Foye Clock Venise [place]    ____________________________________________________________    Clinic Staff:    ? Complete additional steps for documentation double check/Co-Sign.  ? In Follow-up, send chart upon closing encounter to P [facility] HRM AMBULATORY MONITORS    HRM Ambulatory Monitoring Team:  1. Schedule on appropriate template and check-in.   Clinic Placement Schedule on clinic location [facility] schedule   Home Enrollment Schedule on Home Enrollment schedule ([facility] RHYTHM)   Given to patient in clinic for self-placement Schedule on Home Enrollment schedule ([facility] HRT RHYTHM)   Inpatient Schedule on [facility] AMBULATORY MONITORING template   2. Please enroll with appropriate vendor.

## 2021-12-15 ENCOUNTER — Encounter: Admit: 2021-12-15 | Discharge: 2021-12-15 | Payer: MEDICARE

## 2021-12-15 DIAGNOSIS — I483 Typical atrial flutter: Secondary | ICD-10-CM

## 2021-12-15 DIAGNOSIS — I4819 Other persistent atrial fibrillation: Secondary | ICD-10-CM

## 2021-12-15 DIAGNOSIS — G459 Transient cerebral ischemic attack, unspecified: Secondary | ICD-10-CM

## 2021-12-15 MED ORDER — CEPHALEXIN 500 MG PO CAP
ORAL_CAPSULE | 0 refills | Status: CN
Start: 2021-12-15 — End: ?

## 2021-12-15 NOTE — Telephone Encounter
-----   Message from Michiel Cowboy, MD sent at 12/11/2021  5:47 PM CDT -----  [facility], this doesn't show any AF, but she had cryptogenic stroke and I recommended ILR--would she be willing to proceed with the implant?  Thanks.    Cc:  Dr. Shon Hough

## 2021-12-15 NOTE — Telephone Encounter
Called and scheduled patient for ilr implant, gave instructions and answered questions.

## 2021-12-15 NOTE — Telephone Encounter
Called and discussed results with patient. She is agreeable to plan.  Orders placed for ILR implant and monitoring. No questions at this time.  Pt will callback with any questions, concerns or problems.

## 2021-12-25 ENCOUNTER — Encounter: Admit: 2021-12-25 | Discharge: 2021-12-25 | Payer: MEDICARE

## 2021-12-25 NOTE — Progress Notes
website with [employer], [email or web address], confirmed benefits and eligibility:  Current and active since 03/09/2021, $275 copayment to the hospital to max OOP $3600, then plan will pay 100% of allowable charges.  Pre-certification through [employer] is required for ILR Implant 33285.  Prior Josem Kaufmann is not required for the ILR device code [phone]    Case initiated online at insurance website and extensive clinical review completed. Approval received for ILR Implantation 97026 is valid for a single date of service between 12/25/2021 - 03/25/2022  Authorization # [id number]

## 2022-01-12 ENCOUNTER — Encounter: Admit: 2022-01-12 | Discharge: 2022-01-12 | Payer: MEDICARE

## 2022-01-14 ENCOUNTER — Encounter: Admit: 2022-01-14 | Discharge: 2022-01-14 | Payer: MEDICARE

## 2022-01-14 MED ORDER — CEPHALEXIN 500 MG PO CAP
ORAL_CAPSULE | 0 refills
Start: 2022-01-14 — End: ?

## 2022-01-14 NOTE — Telephone Encounter
Called patient and spoke to daughter Arline Asp to schedule ILR procedure. Gave instructions and answered questions.

## 2022-01-22 ENCOUNTER — Encounter: Admit: 2022-01-22 | Discharge: 2022-01-22 | Payer: MEDICARE

## 2022-02-16 ENCOUNTER — Ambulatory Visit: Admit: 2022-02-16 | Discharge: 2022-02-16 | Payer: MEDICARE

## 2022-02-16 ENCOUNTER — Encounter: Admit: 2022-02-16 | Discharge: 2022-02-16 | Payer: MEDICARE

## 2022-02-16 DIAGNOSIS — I483 Typical atrial flutter: Secondary | ICD-10-CM

## 2022-02-16 DIAGNOSIS — I4819 Other persistent atrial fibrillation: Secondary | ICD-10-CM

## 2022-02-16 DIAGNOSIS — G459 Transient cerebral ischemic attack, unspecified: Secondary | ICD-10-CM

## 2022-02-16 NOTE — Patient Instructions
Implantable Cardiac Monitor  Post Procedure Instructions  Incision Care  Remove outer bandage/dressing 24 hours after procedure, leaving Dermabond open to air until incision check appointment.  Refrain from showering for 24 hours post procedure.  After 24 hours, you may shower, however keep incision site as dry as possible.  No submerging in bathtub, pool, hot tub, lake, etc. for 7 days after Implant.  Please send a picture to your physician on MyChart or call [phone] to make an EP nurse visit to have the incision checked in 7-10 days.     Appointment Date, and Time:February 23, 2022.    Location: [facility] Main Office- [address] [place] [address]    Activity Restrictions  Avoid cutting your lawn for at least 7 days after Implant  There are no lifting restrictions. You may drive same day as procedure.  Please limit any type of movement they may stretch the chest area for at least 7 days.    When to Call  Please call our office [phone] if you notice any signs or symptoms of infection including:  Redness, warmth, drainage or swelling at incision site (please note that it is normal to have small amounts of blood-tinged fluid and some swelling immediately following implant. This should resolve within a day)  Fever or chills

## 2022-02-18 ENCOUNTER — Encounter: Admit: 2022-02-18 | Discharge: 2022-02-18 | Payer: MEDICARE

## 2022-02-24 ENCOUNTER — Encounter: Admit: 2022-02-24 | Discharge: 2022-02-24 | Payer: MEDICARE

## 2022-02-24 NOTE — Progress Notes
Left chest ILR incision is clean, dry, well approximated, and healing without evidence of drainage or discharge. Incision was closed using Dermabond and has fallen off. Incision is scabbed over. Pt reports no adverse symptoms. Incision care, including signs and symptoms of infection, and Dermabond healing process reviewed(as below). Pt verbalized understanding and will remain in phone contact.    AVOID TOPICAL MEDICATIONS   Do not apply liquid or ointment medications or any other product to your wound while the  DERMABOND adhesive film is in place. These may loosen the film before your wound is healed.  KEEP WOUND DRY AND PROTECTED   You may occasionally and briefly wet your wound in the shower or bath. Do not soak or scrub  your wound, do not swim, and avoid periods of heavy perspiration until the DERMABOND  adhesive has naturally fallen off. After showering or bathing, gently blot your wound dry with a  soft towel. If a protective dressing is being used, apply a fresh, dry bandage, being sure to keep  the tape off the DERMABOND adhesive film.   Apply a clean, dry bandage over the wound if necessary to protect it.   Protect your wound from injury until the skin has had sufficient time to heal.   Do not scratch, rub, or pick at the DERMABOND adhesive film. This may loosen the film before  your wound is healed.   Protect the wound from prolonged exposure to sunlight or tanning lamps while the film is in  place.    Your incision should gradually look better each day. Please notify our office immediately if you notice any of the following:   -an increase in swelling or redness   -any drainage   -increasing pain at the incision site  -fever over 100 degrees or chills

## 2022-03-26 ENCOUNTER — Encounter: Admit: 2022-03-26 | Discharge: 2022-03-26 | Payer: MEDICARE

## 2022-04-27 ENCOUNTER — Encounter: Admit: 2022-04-27 | Discharge: 2022-04-27 | Payer: MEDICARE

## 2022-05-19 ENCOUNTER — Encounter: Admit: 2022-05-19 | Discharge: 2022-05-19 | Payer: MEDICARE

## 2022-05-19 MED ORDER — OMEPRAZOLE 20 MG PO CPDR
ORAL_CAPSULE | 3 refills | Status: AC
Start: 2022-05-19 — End: ?

## 2022-05-21 ENCOUNTER — Encounter: Admit: 2022-05-21 | Discharge: 2022-05-21 | Payer: MEDICARE

## 2022-06-01 ENCOUNTER — Encounter: Admit: 2022-06-01 | Discharge: 2022-06-01 | Payer: MEDICARE

## 2022-06-09 ENCOUNTER — Encounter: Admit: 2022-06-09 | Discharge: 2022-06-09 | Payer: MEDICARE

## 2022-06-09 DIAGNOSIS — I25721 Atherosclerosis of autologous artery coronary artery bypass graft(s) with angina pectoris with documented spasm: Secondary | ICD-10-CM

## 2022-06-09 DIAGNOSIS — R079 Chest pain, unspecified: Secondary | ICD-10-CM

## 2022-06-09 DIAGNOSIS — I483 Typical atrial flutter: Secondary | ICD-10-CM

## 2022-06-09 DIAGNOSIS — I10 Essential (primary) hypertension: Secondary | ICD-10-CM

## 2022-06-09 DIAGNOSIS — N390 Urinary tract infection, site not specified: Secondary | ICD-10-CM

## 2022-06-09 DIAGNOSIS — I639 Cerebral infarction, unspecified: Secondary | ICD-10-CM

## 2022-06-09 DIAGNOSIS — E785 Hyperlipidemia, unspecified: Secondary | ICD-10-CM

## 2022-06-09 DIAGNOSIS — I2510 Atherosclerotic heart disease of native coronary artery without angina pectoris: Secondary | ICD-10-CM

## 2022-06-09 DIAGNOSIS — G459 Transient cerebral ischemic attack, unspecified: Secondary | ICD-10-CM

## 2022-06-09 DIAGNOSIS — I4819 Other persistent atrial fibrillation: Secondary | ICD-10-CM

## 2022-06-09 NOTE — Assessment & Plan Note
She got a good result from a flutter ablation nearly 2 years ago.  She has an implanted monitor and has had no atrial fibrillation since the monitor was implanted about 9 months ago.

## 2022-06-09 NOTE — Patient Instructions
Follow up as directed.  Call sooner if issues.  Call the [facility] nursing line at [phone].  Leave a detailed message for the nurse in [facility] with how we can assist you and we will call you back.

## 2022-06-09 NOTE — Assessment & Plan Note
BP is fine--I don't want to lower her BP too aggressively with her balance problems.

## 2022-06-09 NOTE — Assessment & Plan Note
She used to have a lot of problems with chest pain that we thought was leading to endothelial dysfunction but this seems to have settled down for her.

## 2022-06-09 NOTE — Assessment & Plan Note
Lab Results   Component Value Date    CHOL 144 08/06/2020    TRIG 190 (H) 08/06/2020    HDL 39 (L) 08/06/2020    LDL 79 08/06/2020    VLDL 38 08/06/2020    NONHDLCHOL 105 08/06/2020    CHOLHDLC 4 08/19/2018      LDL close to goal--she doesn't tolerate higher doses of statin well.

## 2022-06-09 NOTE — Progress Notes
Date of Service: 06/09/2022    Jillian Rose is a 89 y.o. female.       HPI     Jillian Rose was in the [facility] clinic today with her son.  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  The evaluation of cerebrovascular anatomy was pretty unremarkable.  She was on telemetry and no atrial arrhythmias were identified and she has had no symptoms to suggest atrial fibrillation but she was never terribly symptomatic with the rhythm prior to her ablation two years ago.  She underwent a LINQ implant that hasn't ever shown any atrial fibrillation.  She's still having a lot of balance problems after the stroke and falls occasionally.     She is a little depressed and fearful after the stroke.  She like to try to get down to [place] for 1 more winter but is a little bit afraid that she will have more trouble when she gets there.  She lives in her home, near two of her children, and realizes that she wouldn't be in her current living situation without a lot of help from her family.     She is not having problems with exertional dyspnea or angina.  She has not had any syncope or near syncope.         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 Occasional PAC's with a burden of 1.5% and very rare PVC's with burden <1%. Patient submitted triggered events did NOT correlated with any significant cardiac arrhythmias. No severe bradycardia, high grade AV block, atrial flutter, or atrial fibrillation.  One rhythm strip appears to be SVT/AT but cannot rule out small run of Afib lasting 48 seconds. Borderline normal study due to low burden of PAC's and SVT episode which we cannot definitively rule out afib.   Correlate clinically and treat.   10/30/21 Monitor: Unremarkable 30-day event monitor       Persistent atrial fibrillation (HCC) 06/20/2020    Left leg pain 06/18/2016     06/2016 - Left leg swelling and pain.  Venous Duplex negative.  Compression stockings recommended.      Chest pain 10/02/2013    HLD (hyperlipidemia) 10/02/2013    Nephrolithiasis 06/08/2013     03/2013 - Spontaneous passage of kidney stone while in [place]      Venous insufficiency 01/23/2013    Near syncope 10/30/2012    Dyslipidemia 08/04/2012     2014 - Atorvastatin started after CABG and NSTEMI    07/20/12 - Total 155, LDL 101, HDL 44, TG 119.  Atorvastatin doubled to 20 mg/day on 5/29.      CAD (coronary artery disease) 08/03/2012     03/2012- NSTEMI complicating pneumonia.  EF 40-45% by echo.  60% left main, 99% proximal LAD, 60-70% mid-circumflex.  Normal dominant RCA.  EF 40%, distal anterior severe hypokinesis.  03/22/2012 - CABG X 2 (LIMA-LAD, SVG-OM-2).  [facility], [place], [place]., Dr. Barrett Henle.  06/26/15 Echo+Doppler: EF 65%. Mild Abnormal Septal Motion. Mild concentric LVH. Mild LA dilation. Mild AV regurgitation. PASP 37 mmHg.  09/18/21 Echo: EF 65%. No segmental wall motion abnormalities. Mild predominantly mid septal hypertrophy. Grade II (moderate) LV diastolic dysfunction. Elevated left atrial pressure. The right ventricle is at the upper limits of normal, preserved systolic function, M-Mode TAPSE 1.9 cm (normal >1.7 cm). There is mild mitral annular calcification without stenosis. Mild tricuspid valve regurgitation. No interatrial shunting by color flow Doppler and saline contrast studies. Estimated Peak Systolic PA Pressure 34 mmHg      HTN (hypertension) 08/03/2012    TIA (transient ischemic attack) 08/03/2012     08/01/2008      Asthma 08/03/2012    Sleep apnea 08/03/2012    GERD (gastroesophageal reflux disease) 08/03/2012         Review of Systems   Constitutional: Negative.   HENT: Negative.     Eyes: Negative.    Cardiovascular: Negative.    Respiratory: Negative.     Endocrine: Negative.    Hematologic/Lymphatic: Negative.    Skin: Negative.    Musculoskeletal: Negative.    Gastrointestinal: Negative.    Genitourinary: Negative.    Neurological: Negative.    Psychiatric/Behavioral: Negative.     Allergic/Immunologic: Negative.        Physical Exam    Physical Exam   General Appearance: no distress   Skin: warm, no ulcers or xanthomas   Digits and Nails: no cyanosis or clubbing   Eyes: conjunctivae and lids normal, pupils are equal and round   Teeth/Gums/Palate: dentition unremarkable, no lesions   Lips & Oral Mucosa: no pallor or cyanosis   Neck Veins: normal JVP , neck veins are not distended   Thyroid: no nodules, masses, tenderness or enlargement   Chest Inspection: chest is normal in appearance   Respiratory Effort: breathing comfortably, no respiratory distress   Auscultation/Percussion: lungs clear to auscultation, no rales or rhonchi, no wheezing   PMI: PMI not enlarged or displaced   Cardiac Rhythm: regular rhythm and normal rate   Cardiac Auscultation: S1, S2 normal, no rub, no gallop   Murmurs: no murmur   Peripheral Circulation: normal peripheral circulation   Carotid Arteries: normal carotid upstroke bilaterally, no bruits   Radial Arteries: normal symmetric radial pulses   Abdominal Aorta: no abdominal aortic bruit   Pedal Pulses: normal symmetric pedal pulses   Lower Extremity Edema: no lower extremity edema   Abdominal Exam: soft, non-tender, no masses, bowel sounds normal   Liver & Spleen: no organomegaly   Gait & Station: walks with a cane  Muscle Strength: normal muscle tone   Orientation: oriented to time, place and person   Affect & Mood: appropriate and sustained affect   Language and Memory: patient responsive and seems to comprehend information   Neurologic Exam: neurological assessment grossly intact   Other: moves all extremities        Cardiovascular Health Factors  Vitals BP Readings from Last 3 Encounters:   06/09/22 (!) 143/74   10/16/21 116/68   09/18/21 129/57     Wt Readings from Last 3 Encounters:   06/09/22 76.2 kg (168 lb)   10/16/21 79.4 kg (175 lb)   09/18/21 78.9 kg (174 lb)  BMI Readings from Last 3 Encounters:   06/09/22 30.73 kg/m?   10/16/21 32.01 kg/m?   09/18/21 32.88 kg/m?      Smoking Social History     Tobacco Use   Smoking Status Never   Smokeless Tobacco Never      Lipid Profile Cholesterol   Date Value Ref Range Status   08/06/2020 144 <200 MG/DL Final     HDL   Date Value Ref Range Status   08/06/2020 39 (L) >40 MG/DL Final     LDL   Date Value Ref Range Status   08/06/2020 79 <100 mg/dL Final     Triglycerides   Date Value Ref Range Status   08/06/2020 190 (H) <150 MG/DL Final      Blood Sugar No results found for: HGBA1C  Glucose   Date Value Ref Range Status   09/18/2021 93  Final   08/08/2020 92 70 - 100 MG/DL Final   16/12/9602 93 70 - 100 MG/DL Final          Problems Addressed Today  Encounter Diagnoses   Name Primary?    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  She has an implanted monitor and has had no atrial fibrillation since the monitor was implanted about 9 months ago.    CAD (coronary artery disease)  She used to have a lot of problems with chest pain that we thought was leading to endothelial dysfunction but this seems to have settled down for her.    HLD (hyperlipidemia)  Lab Results   Component Value Date    CHOL 144 08/06/2020    TRIG 190 (H) 08/06/2020    HDL 39 (L) 08/06/2020    LDL 79 08/06/2020    VLDL 38 08/06/2020    NONHDLCHOL 105 08/06/2020    CHOLHDLC 4 08/19/2018      LDL close to goal--she doesn't tolerate higher doses of statin well.    HTN (hypertension)  BP is fine--I don't want to lower her BP too aggressively with her balance problems.      Current Medications (including today's revisions)   aspirin 325 mg cap Take 325 mg by mouth daily.    atorvastatin (LIPITOR) 20 mg tablet TAKE 1 TABLET EVERY DAY (Patient taking differently: Take two tablets by mouth daily.)    cephalexin (KEFLEX) 500 mg capsule Take 4 capsules (2000 mg total) by mouth 30-60 minutes prior to your procedure on 12/11    cholecalciferol (vitamin D3) (VITAMIN D3) 1,000 units tablet Take one tablet by mouth daily.    fluticasone/salmeterol (ADVAIR DISKUS) 250/50 mcg inhalation disk Inhale one puff by mouth into the lungs every 12 hours.    hydroCHLOROthiazide 12.5 mg tablet TAKE 1 TABLET EVERY MORNING    hydrOXYzine HCL (ATARAX) 25 mg tablet Take one tablet by mouth as Needed.    isosorbide mononitrate ER (IMDUR) 60 mg tablet TAKE 1 TABLET EVERY MORNING  lactobacillus combination no.4 15 billion cell cap Take 1 Cap by mouth daily.    latanoprost (XALATAN) 0.005 % ophthalmic solution Apply one drop to both eyes at bedtime daily.    mometasone-formoterol (DULERA) 200-5 mcg/actuation inhalation Inhale  by mouth into the lungs twice daily.    omeprazole DR (PRILOSEC) 20 mg capsule TAKE 1 CAPSULE EVERY DAY    PAPAYA ENZYME PO Take  by mouth as Needed.    sertraline (ZOLOFT) 50 mg tablet Take one tablet by mouth daily.    tiotropium bromide (SPIRIVA) 18 mcg capsule for inhaler Place one capsule into inhaler and inhale into lungs as directed daily.    vitamins, multiple tablet Take one tablet by mouth daily.     Total time spent on today's office visit was 45 minutes.  This includes face-to-face in person visit with patient as well as nonface-to-face time including review of the EMR, outside records, labs, radiologic studies, echocardiogram & other cardiovascular studies, formation of treatment plan, after visit summary, future disposition, and lastly on documentation.

## 2022-07-06 ENCOUNTER — Encounter: Admit: 2022-07-06 | Discharge: 2022-07-06 | Payer: MEDICARE

## 2022-08-10 ENCOUNTER — Encounter: Admit: 2022-08-10 | Discharge: 2022-08-10 | Payer: MEDICARE

## 2022-09-14 ENCOUNTER — Encounter: Admit: 2022-09-14 | Discharge: 2022-09-14 | Payer: MEDICARE

## 2022-09-28 ENCOUNTER — Encounter: Admit: 2022-09-28 | Discharge: 2022-09-28 | Payer: MEDICARE

## 2022-09-28 NOTE — Telephone Encounter
-----   Message from [place] H sent at 09/28/2022  3:12 PM CDT -----  Regarding: FW: SDO LUX pt with two sinus arrhythmia with ectopy vs AFL? -- VS between 72 bpm-147 bpm    ----- Message -----  From: Elpidio Anis A  Sent: 09/28/2022   3:05 PM CDT  To: Cvm Nurse Gen Card Team Red  Subject: SDO LUX pt with two sinus arrhythmia with ec#    Open the attached PDF for the [facility] device report and data sheets.

## 2022-09-28 NOTE — Telephone Encounter
Pt reports symptoms while working hard outside.  Patient reports when these occur she rest and they go away.  She is feeling fine currently.

## 2022-09-30 ENCOUNTER — Encounter: Admit: 2022-09-30 | Discharge: 2022-09-30 | Payer: MEDICARE

## 2022-09-30 MED ORDER — METOPROLOL TARTRATE 25 MG PO TAB
25 mg | ORAL_TABLET | Freq: Two times a day (BID) | ORAL | 1 refills | 90.00000 days | Status: AC
Start: 2022-09-30 — End: ?

## 2022-09-30 NOTE — Telephone Encounter
Vanice Sarah, MD  Jillian Rose  Caller: Unspecified (2 days ago,  5:19 PM)  Would she be willing to start metoprolol tartrate 25 mg BID.  I should probably get her into clinic fairly soon.  She had flutter ablation a couple of years ago.        Previous Messages    PATIENT CALL  Doctor, hospital [facility])  View All Conversations on this Encounter  Vanice Sarah, MD  [name] (6:19 AM)      Would she be willing to start metoprolol tartrate 25 mg BID.  I should probably get her into clinic fairly soon.  She had flutter ablation a couple of years ago.    You  Danella Maiers D, MD2 days ago    SM  Abnormal linq    You2 days ago    SM  Pt reports symptoms while working hard outside.  Patient reports when these occur she rest and they go away.  She is feeling fine currently.        Note       You2 days ago    SM  ----- Message from [place] H sent at 09/28/2022  3:12 PM CDT -----  Regarding: FW: SDO LUX pt with two sinus arrhythmia with ectopy vs AFL? -- VS between 72 bpm-147 bpm     ----- Message -----  From: Elpidio Anis A  Sent: 09/28/2022   3:05 PM CDT  To: Cvm Nurse Gen Card Team Red  Subject: SDO LUX pt with two sinus arrhythmia with ec#     Open the attached PDF for the [facility] device report and data sheets.

## 2022-09-30 NOTE — Telephone Encounter
Called pt with recommendations sched for follow up.

## 2022-11-04 ENCOUNTER — Encounter: Admit: 2022-11-04 | Discharge: 2022-11-04 | Payer: MEDICARE

## 2022-11-05 ENCOUNTER — Encounter: Admit: 2022-11-05 | Discharge: 2022-11-05 | Payer: MEDICARE

## 2022-11-05 DIAGNOSIS — G459 Transient cerebral ischemic attack, unspecified: Secondary | ICD-10-CM

## 2022-11-05 DIAGNOSIS — I2510 Atherosclerotic heart disease of native coronary artery without angina pectoris: Secondary | ICD-10-CM

## 2022-11-05 DIAGNOSIS — E785 Hyperlipidemia, unspecified: Secondary | ICD-10-CM

## 2022-11-05 DIAGNOSIS — M5432 Sciatica, left side: Secondary | ICD-10-CM

## 2022-11-05 DIAGNOSIS — I10 Essential (primary) hypertension: Secondary | ICD-10-CM

## 2022-11-05 DIAGNOSIS — R55 Syncope and collapse: Secondary | ICD-10-CM

## 2022-11-05 DIAGNOSIS — I4892 Unspecified atrial flutter: Secondary | ICD-10-CM

## 2022-11-05 DIAGNOSIS — R079 Chest pain, unspecified: Secondary | ICD-10-CM

## 2022-11-05 DIAGNOSIS — N390 Urinary tract infection, site not specified: Secondary | ICD-10-CM

## 2022-11-05 NOTE — Assessment & Plan Note
She is still having a lot of pain and requiring narcotic analgesics.  I suggested an evaluation at the [place] [facility].

## 2022-11-05 NOTE — Assessment & Plan Note
Lab Results   Component Value Date    CHOL 144 08/06/2020    TRIG 190 (H) 08/06/2020    HDL 39 (L) 08/06/2020    LDL 79 08/06/2020    VLDL 38 08/06/2020    NONHDLCHOL 105 08/06/2020    CHOLHDLC 4 08/19/2018      LDL looks OK.

## 2022-11-05 NOTE — Assessment & Plan Note
She is not having any chest pain or other symptoms suggesting cardiac ischemia.

## 2022-11-05 NOTE — Assessment & Plan Note
I had communicated with the hospitalist that the implanted monitor is MRI compatible.    We get occasional alerts but nothing that looks like atrial fibrillation or flutter.

## 2022-11-05 NOTE — Assessment & Plan Note
We have allowed a bit of permissive hypertension, given her history of gait instability.

## 2022-11-05 NOTE — Progress Notes
Date of Service: 11/05/2022    CAMEREN ODWYER is a 89 y.o. female.       HPI     Jillian Rose was in the [place] clinic today with her son and daughter.  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  The evaluation of cerebrovascular anatomy was pretty unremarkable.  She was on telemetry and no atrial arrhythmias were identified and she has had no symptoms to suggest atrial fibrillation but she was never terribly symptomatic with the rhythm prior to her ablation two years ago.  She underwent a LINQ implant that hasn't ever shown any atrial fibrillation.  She's still having a lot of balance problems after the stroke and falls occasionally.    More recently she was hospitalized for left sciatica.  She underwent epidural that didn't help much.  She's getting PT at a SNF, using a walker and gait belt, and her quality of life has taken another downward step from where it was post-stroke.  Fortunately her mood is a lot better than it was right after her stroke.     She is not having problems with exertional dyspnea or angina.  She has not had any syncope or near syncope.      She has a new CPAP machine and her family is going to work with her to be sure she can operate it properly.         Vitals:    11/05/22 1027   BP: (!) 146/79   BP Source: Arm, Right Upper   Pulse: 70   SpO2: 97%   O2 Device: None (Room air)   PainSc: Seven   Weight: 73 kg (161 lb)   Height: 157.5 cm (5' 2)     Body mass index is 29.45 kg/m?Marland Kitchen     Past Medical History  Patient Active Problem List    Diagnosis Date Noted    Sciatica of left side 11/05/2022    History of adverse reaction to anesthesia 08/06/2020    Paroxysmal atrial flutter (HCC) 08/06/2020     08/07/20 ABLATION WITH COMPREHENSIVE ELECTROPHYSIOLOGIC EVALUATION - TYPICAL FLUTTER  09/17/20 Monitor: 14 days. Occasional PAC's with a burden of 1.5% and very rare PVC's with burden <1%. Patient submitted triggered events did NOT correlated with any significant cardiac arrhythmias. No severe bradycardia, high grade AV block, atrial flutter, or atrial fibrillation.  One rhythm strip appears to be SVT/AT but cannot rule out small run of Afib lasting 48 seconds. Borderline normal study due to low burden of PAC's and SVT episode which we cannot definitively rule out afib.   Correlate clinically and treat.   10/30/21 Monitor: Unremarkable 30-day event monitor       Left leg pain 06/18/2016     06/2016 - Left leg swelling and pain.  Venous Duplex negative.  Compression stockings recommended.      Chest pain 10/02/2013    HLD (hyperlipidemia) 10/02/2013    Nephrolithiasis 06/08/2013     03/2013 - Spontaneous passage of kidney stone while in [place]      Venous insufficiency 01/23/2013    Near syncope 10/30/2012    Dyslipidemia 08/04/2012     2014 - Atorvastatin started after CABG and NSTEMI    07/20/12 - Total 155, LDL 101, HDL 44, TG 119.  Atorvastatin doubled to 20 mg/day on 5/29.      CAD (coronary artery disease) 08/03/2012     03/2012- NSTEMI complicating pneumonia.  EF 40-45% by echo.  60% left main, 99% proximal LAD, 60-70% mid-circumflex.  Normal dominant RCA.  EF 40%, distal anterior severe hypokinesis.  03/22/2012 - CABG X 2 (LIMA-LAD, SVG-OM-2).  [facility], [place], [place]., Dr. Barrett Henle.  06/26/15 Echo+Doppler: EF 65%. Mild Abnormal Septal Motion. Mild concentric LVH. Mild LA dilation. Mild AV regurgitation. PASP 37 mmHg.  09/18/21 Echo: EF 65%. No segmental wall motion abnormalities. Mild predominantly mid septal hypertrophy. Grade II (moderate) LV diastolic dysfunction. Elevated left atrial pressure. The right ventricle is at the upper limits of normal, preserved systolic function, M-Mode TAPSE 1.9 cm (normal >1.7 cm). There is mild mitral annular calcification without stenosis. Mild tricuspid valve regurgitation. No interatrial shunting by color flow Doppler and saline contrast studies. Estimated Peak Systolic PA Pressure 34 mmHg      HTN (hypertension) 08/03/2012    TIA (transient ischemic attack) 08/03/2012     08/01/2008      Asthma 08/03/2012    Sleep apnea 08/03/2012    GERD (gastroesophageal reflux disease) 08/03/2012         Review of Systems   Constitutional: Negative.   HENT: Negative.     Eyes: Negative.    Cardiovascular: Negative.    Respiratory: Negative.     Endocrine: Negative.    Hematologic/Lymphatic: Negative.    Skin: Negative.    Musculoskeletal: Negative.    Gastrointestinal: Negative.    Genitourinary: Negative.    Neurological: Negative.    Psychiatric/Behavioral: Negative.     Allergic/Immunologic: Negative.        Physical Exam    Physical Exam   General Appearance: no distress   Skin: warm, no ulcers or xanthomas   Digits and Nails: no cyanosis or clubbing   Eyes: conjunctivae and lids normal, pupils are equal and round   Teeth/Gums/Palate: dentition unremarkable, no lesions   Lips & Oral Mucosa: no pallor or cyanosis   Neck Veins: normal JVP , neck veins are not distended   Thyroid: no nodules, masses, tenderness or enlargement   Chest Inspection: chest is normal in appearance   Respiratory Effort: breathing comfortably, no respiratory distress   Auscultation/Percussion: lungs clear to auscultation, no rales or rhonchi, no wheezing   PMI: PMI not enlarged or displaced   Cardiac Rhythm: regular rhythm and normal rate   Cardiac Auscultation: S1, S2 normal, no rub, no gallop   Murmurs: no murmur   Peripheral Circulation: normal peripheral circulation   Carotid Arteries: normal carotid upstroke bilaterally, no bruits   Radial Arteries: normal symmetric radial pulses   Abdominal Aorta: no abdominal aortic bruit   Pedal Pulses: normal symmetric pedal pulses   Lower Extremity Edema: no lower extremity edema   Abdominal Exam: soft, non-tender, no masses, bowel sounds normal   Liver & Spleen: no organomegaly   Gait & Station: walks with a walker & gait belt  Muscle Strength: normal muscle tone   Orientation: oriented to time, place and person   Affect & Mood: appropriate and sustained affect   Language and Memory: patient responsive and seems to comprehend information   Neurologic Exam: neurological assessment grossly intact   Other: moves all extremities      Cardiovascular Health Factors  Vitals BP Readings from Last 3 Encounters:   11/05/22 (!) 146/79   06/09/22 (!) 143/74   10/16/21 116/68     Wt Readings from Last 3 Encounters:   11/05/22 73 kg (161 lb)   06/09/22 76.2 kg (168 lb)   10/16/21 79.4 kg (175 lb)  BMI Readings from Last 3 Encounters:   11/05/22 29.45 kg/m?   06/09/22 30.73 kg/m?   10/16/21 32.01 kg/m?      Smoking Social History     Tobacco Use   Smoking Status Never   Smokeless Tobacco Never      Lipid Profile Cholesterol   Date Value Ref Range Status   08/06/2020 144 <200 MG/DL Final     HDL   Date Value Ref Range Status   08/06/2020 39 (L) >40 MG/DL Final     LDL   Date Value Ref Range Status   08/06/2020 79 <100 mg/dL Final     Triglycerides   Date Value Ref Range Status   08/06/2020 190 (H) <150 MG/DL Final      Blood Sugar No results found for: HGBA1C  Glucose   Date Value Ref Range Status   10/22/2022 100  Final   10/18/2022 103  Final   09/18/2021 93  Final          Problems Addressed Today  Encounter Diagnoses   Name Primary?    Left sciatic nerve pain Yes    Near syncope     TIA (transient ischemic attack)     Primary hypertension     Paroxysmal atrial flutter (HCC)     Coronary artery disease involving native coronary artery of native heart without angina pectoris     Hyperlipidemia, unspecified hyperlipidemia type     Sciatica of left side        Assessment and Plan       Paroxysmal atrial flutter (HCC)  I had communicated with the hospitalist that the implanted monitor is MRI compatible.    We get occasional alerts but nothing that looks like atrial fibrillation or flutter.    CAD (coronary artery disease)  She is not having any chest pain or other symptoms suggesting cardiac ischemia.    HLD (hyperlipidemia)  Lab Results   Component Value Date    CHOL 144 08/06/2020    TRIG 190 (H) 08/06/2020    HDL 39 (L) 08/06/2020    LDL 79 08/06/2020    VLDL 38 08/06/2020    NONHDLCHOL 105 08/06/2020    CHOLHDLC 4 08/19/2018      LDL looks OK.    HTN (hypertension)  We have allowed a bit of permissive hypertension, given her history of gait instability.    Sciatica of left side  She is still having a lot of pain and requiring narcotic analgesics.  I suggested an evaluation at the [facility].      Current Medications (including today's revisions)   aspirin 325 mg cap Take 325 mg by mouth daily.    atorvastatin (LIPITOR) 20 mg tablet TAKE 1 TABLET EVERY DAY (Patient taking differently: Take two tablets by mouth daily.)    baclofen 5 mg tablet Take one tablet by mouth twice daily.    bisacodyL (DULCOLAX (BISACODYL)) 5 mg tablet Take one tablet by mouth every 24 hours as needed for Constipation.    bisacodyL (DULCOLAX) 10 mg rectal suppository Insert or Apply one suppository to rectal area as directed daily.    cephalexin (KEFLEX) 500 mg capsule Take 4 capsules (2000 mg total) by mouth 30-60 minutes prior to your procedure on 12/11    cholecalciferol (vitamin D3) (VITAMIN D3) 1,000 units tablet Take one tablet by mouth daily.    duloxetine DR (CYMBALTA) 30 mg capsule Take one capsule by mouth daily.    fluticasone/salmeterol (ADVAIR DISKUS) 250/50  mcg inhalation disk Inhale one puff by mouth into the lungs every 12 hours.    gabapentin (NEURONTIN) 100 mg capsule Take one capsule by mouth every 8 hours.    hydroCHLOROthiazide 12.5 mg tablet TAKE 1 TABLET EVERY MORNING    hydrOXYzine HCL (ATARAX) 25 mg tablet Take one tablet by mouth as Needed.    isosorbide mononitrate ER (IMDUR) 60 mg tablet TAKE 1 TABLET EVERY MORNING    lactobacillus combination no.4 15 billion cell cap Take 1 Cap by mouth daily.    latanoprost (XALATAN) 0.005 % ophthalmic solution Apply one drop to both eyes at bedtime daily.    metoprolol tartrate 25 mg tablet Take one tablet by mouth twice daily.    mometasone-formoterol (DULERA) 200-5 mcg/actuation inhalation Inhale  by mouth into the lungs twice daily.    naproxen (NAPROSYN) 250 mg tablet Take one tablet by mouth twice daily with meals. Take with food.    omeprazole DR (PRILOSEC) 20 mg capsule TAKE 1 CAPSULE EVERY DAY    oxyCODONE-acetaminophen (PERCOCET) 5-325 mg tablet Take one tablet by mouth every 6 hours as needed for Pain.    PAPAYA ENZYME PO Take  by mouth as Needed.    senna (SENOKOT) 8.6 mg tablet Take two tablets by mouth at bedtime as needed for Constipation.    sertraline (ZOLOFT) 50 mg tablet Take one tablet by mouth daily.    tiotropium bromide (SPIRIVA) 18 mcg capsule for inhaler Place one capsule into inhaler and inhale into lungs as directed daily.    Trolamine Salicylate 10 % crea Apply  topically to affected area.    vitamins, multiple tablet Take one tablet by mouth daily.     Total time spent on today's office visit was 45 minutes.  This includes face-to-face in person visit with patient as well as nonface-to-face time including review of the EMR, outside records, labs, radiologic studies, echocardiogram & other cardiovascular studies, formation of treatment plan, after visit summary, future disposition, and lastly on documentation.

## 2022-11-11 ENCOUNTER — Encounter: Admit: 2022-11-11 | Discharge: 2022-11-11 | Payer: MEDICARE

## 2022-11-11 NOTE — Telephone Encounter
Lvm for patient to complete new patient paperwork and to bring any images and reports related to the pain. Advised patient to arrive 30 minutes early for new check in process.

## 2022-11-16 NOTE — Progress Notes
[facility] NEW PATIENT CLINIC NOTE       Dear Dr. Vanice Sarah,     I appreciate your kind referral of Jillian Rose for evaluation of pain. Please see my note below for the full details of the evaluation and management plan.     Thank you,     Elvia Collum, MD    CC:    Chief Complaint   Patient presents with    Left Leg - Pain    Lower Back - Pain        Referring Provider: Vanice Sarah     HPI: Jillian Rose has a past medical history of CAD (coronary artery disease), Chest pain, HTN (hypertension), TIA (transient ischemic attack), and UTI (lower urinary tract infection). presents for evaluation of back and left leg pain.  She is a patient of Dr. Barry Dienes and was seen in clinic on 11/05/22 for follow up on aflutter.  She recently had a crytogenic stroke for which she is in a SNF where she does PT.  Despite PT and an epidural, she continues to have back and leg pain.  Given ongoing issues, she was referred here today for further evaluation.  She states about a month ago (6 weeks), she went to the chiropractor because she was having pain at the bottom of her spine.  She states it started radiating into the left hip and down to her big toe posterolaterally.  She has no radiating right leg pain.  She otherwise denies any new cardiac CP, SOB, or abdominal pain.     Inciting event:  Unsure, happened after bending over to wash her toes washing  Pain Location:  back and left leg pain.    NRS:  7-8/10,  Pain Described as:  shooting, screaming.    Numbness/Tingling:  Yes  Weakness:  Yes (subjective left leg weakness.  She does have a history of TIA)    Aggravating Factors:  Unsure, just happens sporadically     Alleviating Factors:  rubbing her leg.      Prior Treatments:  Gabapentin:  Yes (helps some)  Lyrica:  No  Cymbalta:  No  Amitriptyline:  No  Nortriptyline:   No  Muscle relaxants:  Yes (helps some)  Narcotics:  Yes (percocet from ED/PCP)  Physical Therapy:  Yes (last completed after released from [facility] for stroke like symptoms where she went to an AIR for about 2 weeks)  HEP:  No    Prior Injections:    ESI's with Mr. Edwyna Shell CRNA on October 19 2022 only helped for 3 days.      Prior Relevant Surgeries:  None         Review of Systems   Constitutional:  Positive for activity change, appetite change, fatigue and unexpected weight change. Negative for chills, diaphoresis and fever.   HENT:  Negative for congestion, dental problem, drooling, ear discharge, ear pain, facial swelling, hearing loss, mouth sores, nosebleeds, postnasal drip, rhinorrhea, sinus pressure, sinus pain, sneezing, sore throat, tinnitus, trouble swallowing and voice change.    Eyes:  Negative for photophobia, pain, discharge, redness, itching and visual disturbance.   Respiratory:  Positive for apnea. Negative for cough, choking, chest tightness, shortness of breath, wheezing and stridor.    Cardiovascular:  Positive for leg swelling. Negative for chest pain and palpitations.   Endocrine: Negative for cold intolerance, heat intolerance, polydipsia, polyphagia and polyuria.   Genitourinary:  Positive for flank pain, frequency and urgency. Negative  for decreased urine volume, difficulty urinating, dyspareunia, dysuria, enuresis, genital sores, hematuria, menstrual problem, pelvic pain, vaginal bleeding, vaginal discharge and vaginal pain.   Musculoskeletal:  Positive for back pain and gait problem. Negative for arthralgias, joint swelling, myalgias, neck pain and neck stiffness.   Skin:  Negative for color change, pallor, rash and wound.   Allergic/Immunologic: Negative for environmental allergies, food allergies and immunocompromised state.   Neurological:  Positive for dizziness, weakness, light-headedness, numbness and headaches. Negative for tremors, seizures, syncope, facial asymmetry and speech difficulty.   Hematological:  Negative for adenopathy. Does not bruise/bleed easily.   Psychiatric/Behavioral:  Positive for confusion, decreased concentration and sleep disturbance. Negative for agitation, behavioral problems, dysphoric mood, hallucinations, self-injury and suicidal ideas. The patient is nervous/anxious. The patient is not hyperactive.        Current Outpatient Medications:     acetaminophen (TYLENOL EXTRA STRENGTH) 500 mg tablet, two tablets., Disp: , Rfl:     aspirin 325 mg cap, Take 325 mg by mouth daily., Disp: , Rfl:     atorvastatin (LIPITOR) 20 mg tablet, TAKE 1 TABLET EVERY DAY (Patient taking differently: Take two tablets by mouth daily.), Disp: 90 tablet, Rfl: 3    baclofen 5 mg tablet, Take one tablet by mouth twice daily., Disp: , Rfl:     bisacodyL (DULCOLAX (BISACODYL)) 5 mg tablet, Take one tablet by mouth every 24 hours as needed for Constipation., Disp: , Rfl:     bisacodyL (DULCOLAX) 10 mg rectal suppository, Insert or Apply one suppository to rectal area as directed daily., Disp: , Rfl:     cephalexin (KEFLEX) 500 mg capsule, Take 4 capsules (2000 mg total) by mouth 30-60 minutes prior to your procedure on 12/11, Disp: 4 capsule, Rfl: 0    cholecalciferol (vitamin D3) (VITAMIN D3) 1,000 units tablet, Take one tablet by mouth daily., Disp: , Rfl:     duloxetine DR (CYMBALTA) 30 mg capsule, Take one capsule by mouth daily., Disp: , Rfl:     enoxaparin (LOVENOX) 40 mg injection syringe, Inject 0.4 mL under the skin daily., Disp: , Rfl:     fluticasone/salmeterol (ADVAIR DISKUS) 250/50 mcg inhalation disk, Inhale one puff by mouth into the lungs every 12 hours., Disp: , Rfl:     gabapentin (NEURONTIN) 100 mg capsule, Take three capsules by mouth every 8 hours., Disp: , Rfl:     hydroCHLOROthiazide 12.5 mg tablet, TAKE 1 TABLET EVERY MORNING, Disp: 90 tablet, Rfl: 3    hydrOXYzine HCL (ATARAX) 25 mg tablet, Take one tablet by mouth as Needed., Disp: , Rfl:     isosorbide mononitrate ER (IMDUR) 60 mg tablet, TAKE 1 TABLET EVERY MORNING, Disp: 90 tablet, Rfl: 3    lactobacillus combination no.4 15 billion cell cap, Take 1 Cap by mouth daily., Disp: , Rfl:     latanoprost (XALATAN) 0.005 % ophthalmic solution, Apply one drop to both eyes at bedtime daily., Disp: , Rfl:     meloxicam (MOBIC) 7.5 mg tablet, Take 7.5 mg every 24 hours by oral route., Disp: , Rfl:     methylPREDNIsolone (MEDROL (PAK)) 4 mg tablet, Take medication as directed on package for 6 days. Take with food., Disp: 21 tablet, Rfl: 0    metoprolol tartrate 25 mg tablet, Take one tablet by mouth twice daily., Disp: 180 tablet, Rfl: 1    mometasone-formoterol (DULERA) 200-5 mcg/actuation inhalation, Inhale  by mouth into the lungs twice daily., Disp: , Rfl:     naproxen (NAPROSYN) 250  mg tablet, Take one tablet by mouth twice daily with meals. Take with food., Disp: , Rfl:     omeprazole DR (PRILOSEC) 20 mg capsule, TAKE 1 CAPSULE EVERY DAY, Disp: 90 capsule, Rfl: 3    ondansetron (ZOFRAN ODT) 4 mg rapid dissolve tablet, Place 4 mg every 6 hours by translingual route as needed., Disp: , Rfl:     oxyCODONE-acetaminophen (PERCOCET) 5-325 mg tablet, Take one tablet by mouth every 6 hours as needed for Pain., Disp: , Rfl:     PAPAYA ENZYME PO, Take  by mouth as Needed., Disp: , Rfl:     senna (SENOKOT) 8.6 mg tablet, Take two tablets by mouth at bedtime as needed for Constipation., Disp: , Rfl:     sertraline (ZOLOFT) 50 mg tablet, Take one tablet by mouth daily., Disp: , Rfl:     tiotropium bromide (SPIRIVA) 18 mcg capsule for inhaler, Place one capsule into inhaler and inhale into lungs as directed daily., Disp: , Rfl:     Trolamine Salicylate 10 % crea, Apply  topically to affected area., Disp: , Rfl:     vitamins, multiple tablet, Take one tablet by mouth daily., Disp: , Rfl:   Allergies   Allergen Reactions    Propofol DYSTONIA    Levofloxacin NAUSEA AND VOMITING       Past Medical History:   Diagnosis Date    CAD (coronary artery disease)     Chest pain     HTN (hypertension)     TIA (transient ischemic attack)     UTI (lower urinary tract infection) Surgical History:   Procedure Laterality Date    INTRACARDIAC CATHETER ABLATION WITH COMPREHENSIVE ELECTROPHYSIOLOGIC EVALUATION - TYPICAL FLUTTER N/A 08/07/2020    Performed by Lorain Childes, MD at [facility] EP LAB    TRANSESOPHAGEAL ECHOCARDIOGRAM DURING INTERVENTION N/A 08/07/2020    Performed by Glynda Jaeger, MD at [facility] EP LAB      Family History   Problem Relation Name Age of Onset    Blood Clots Father  4    Cancer Sister Roddie Mc         Breast        No data recorded   Is a controlled substance agreement on file?No    Physical examination:   BP (!) 152/68 (BP Source: Arm, Left Upper, Patient Position: Sitting)  - Pulse 64  - Ht 157.5 cm (5' 2)  - SpO2 98%  - BMI 29.45 kg/m?   Pain Score: Ten    General: Alert, cooperative, no distress  Head: Normocephalic, atraumatic  Eyes: Conjunctivae/corneas clear  Lungs: Unlabored respirations  Heart: Normal rate by palpation of pulse  Abdomen: Non-distended  Skin: Warm and dry to touch  Psychiatric: Mood and affect normal  Musculoskeletal: Moves all extremities  Neurological: Grossly intact.  CN II to XII intact  Patient uses wheelchair to travel      I reviewed the following with the patient:    Spine X-Ray Results:  None new relevant imaging for review.    MRI C-Spine Results:  None new relevant imaging for review.    MRI L-Spine Results:  None new relevant imaging for review.    MRI T-Spine Results:  None new relevant imaging for review.    11/10/22 CT L spine  Disc osteophyte complex at L2-3  Mild moderate central stenosis at L2-3, L3-4, L4-5  Mild bilateral NF stenosis at L2-3, L3-4, L4-5  Grade 1 spondy at L4-5  ASSESSMENT:    1. Lumbar radiculopathy  TENS UNIT    methylPREDNIsolone (MEDROL (PAK)) 4 mg tablet           Jillian Rose presents with a history, exam, and diagnostic work up indicative of lumbar radiculopathy.  We reviewed her history and imaging together today.  She had a CT scan that showed multiple disc osteophyte complexes and associated central stenosis with NF stenosis.  She notes having radiating left leg pain concerning for radiculopathy.  She had an epidural with a nurse anesthetist at home with a few days of improvement per her.  But upon asking, her pain is currently much better than before.  She still has episodic pain in the left leg.  She has a significant neurovascular and cardiovascular disease.  We discussed treatment options and we could consider a different type of epidural if she could give [place] information about what injection she had prior.  However, we are also cognizant of her risks with the need to stop her aspirin.  We focused on trying less invasive options for treatment.  She is about to transfer to a new facility where she will work with a therapist.  She has pain with sitting which is what bothers her.  She has not tried a TENs unit and could consider a TENs unit for this.  Further, she has not tried a medrol dose pak.  We discussed risk of decreased bone mineral density with repeated steroid exposures.  Given risk of cardio vascular or neurovascular issues with ASA cessation, they were in agreement to trying medrol dose pak in lieu of injection for now.        Patient has had an adequate trial of > 1 month of rest, exercise, multimodal treatment, and the passage of time without improvement of symptoms. The pain has significant impact on the daily quality of life.     PLAN:      Diagnostics:  Obtain OSH MRI  Therapy:  Agree with PT  Medication changes:  Trial medrol dose pa  Interventions:  Could consider injections in future if pain persists.  However, given her history of MI and TIAs, there is some concerns about holding her full dose ASA.  Will need to see where/what the prior type of injection was   Follow up:  As needed for now  Other:  TENs unit order    Thank you for this kind referral for consultation. Please feel free to contact me with any questions or concerns.    CC:  Vanice Sarah, MD         Answers submitted by the patient for this visit:  Review Of Systems (Submitted on 11/12/2022)  Crying: No

## 2022-11-18 ENCOUNTER — Encounter: Admit: 2022-11-18 | Discharge: 2022-11-18 | Payer: MEDICARE

## 2022-11-18 DIAGNOSIS — N390 Urinary tract infection, site not specified: Secondary | ICD-10-CM

## 2022-11-18 DIAGNOSIS — I10 Essential (primary) hypertension: Secondary | ICD-10-CM

## 2022-11-18 DIAGNOSIS — R079 Chest pain, unspecified: Secondary | ICD-10-CM

## 2022-11-18 DIAGNOSIS — I2510 Atherosclerotic heart disease of native coronary artery without angina pectoris: Secondary | ICD-10-CM

## 2022-11-18 DIAGNOSIS — G459 Transient cerebral ischemic attack, unspecified: Secondary | ICD-10-CM

## 2022-11-18 MED ORDER — METHYLPREDNISOLONE 4 MG PO DSPK
ORAL_TABLET | 0 refills | Status: AC
Start: 2022-11-18 — End: ?

## 2022-11-19 ENCOUNTER — Ambulatory Visit: Admit: 2022-11-18 | Discharge: 2022-11-19 | Payer: MEDICARE

## 2022-11-19 DIAGNOSIS — I10 Essential (primary) hypertension: Secondary | ICD-10-CM

## 2022-11-19 DIAGNOSIS — G459 Transient cerebral ischemic attack, unspecified: Secondary | ICD-10-CM

## 2022-11-19 DIAGNOSIS — M5432 Sciatica, left side: Secondary | ICD-10-CM

## 2022-11-19 DIAGNOSIS — M5416 Radiculopathy, lumbar region: Secondary | ICD-10-CM

## 2022-11-19 DIAGNOSIS — R55 Syncope and collapse: Secondary | ICD-10-CM

## 2022-11-26 ENCOUNTER — Encounter: Admit: 2022-11-26 | Discharge: 2022-11-26 | Payer: MEDICARE

## 2022-11-26 NOTE — Telephone Encounter
-----   Message from [place] A sent at 11/26/2022  9:45 AM CDT -----    ----- Message -----  From: Lauralee Evener, RN  Sent: 11/26/2022   9:18 AM CDT  To: [employer]    This patient's daughter has some questions about ILR remote monitoring. Not sure if you will be able to answer them but I'm hoping will know where to direct her. Please give her a call. Her name is Elby Showers and phone number is [phone].    Thank you,  Shawna Orleans, RN  [employer] C[facility]

## 2022-12-08 ENCOUNTER — Encounter: Admit: 2022-12-08 | Discharge: 2022-12-08 | Payer: MEDICARE

## 2022-12-09 ENCOUNTER — Encounter: Admit: 2022-12-09 | Discharge: 2022-12-09 | Payer: MEDICARE

## 2022-12-09 DIAGNOSIS — Z95818 Presence of other cardiac implants and grafts: Secondary | ICD-10-CM

## 2022-12-09 DIAGNOSIS — I4892 Unspecified atrial flutter: Secondary | ICD-10-CM

## 2022-12-21 IMAGING — MR AGHEADWO
2 of 5 series · 2 of 48 positions shown · non-contrast
Comparison: none

[Series 1034: full rotate · axial · 266.1mm · 0.97mm/px · 1 of 1 slices shown]
[im 1/1]
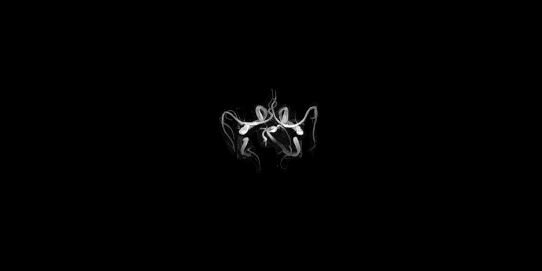

[Series 1041: 180 rotate · axial · 266.1mm · 0.97mm/px · 1 of 1 slices shown]
[im 1/1]
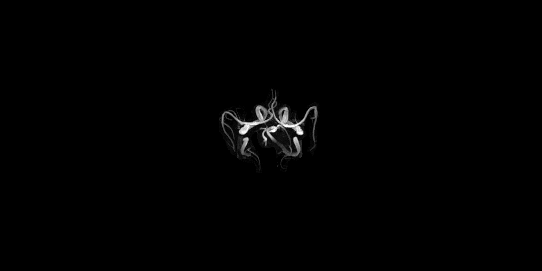

[2 of 48 positions shown; findings below may reference images not displayed]

DIAGNOSTIC STUDIES

EXAM

MRI of the brain with without contrast and MR angiography.

INDICATION

tia, weakness
PT WOKE UP YESTERDAY WITH LELIS "NOISE" THROUGHTOUT HEAD, SEVERE HEADACHE, UNABLE TO STAND OR WALK,
CONFUSION, TODAY PT HAS LEFT SIDED WEAKNESS (IMPROVED FROM YESTERDAY) AND IMBALANCE.  15 ML GADAVIST
RG

TECHNIQUE

Sagittal axial coronal images variable T1 and T2 weighting. 3D  time-of-flight MR angiography
through the circle Willis was obtained.

COMPARISONS

None available

FINDINGS

Minimal T2 signal in the pons seen consistent with mild small vessel ischemic change. The expected
signal voids are noted throughout the major intracranial vascular structures, mastoid air cells,
paranasal sinuses.

There is cortical atrophy and proportional enlargement of the ventricular system. Small vessel
ischemic changes are noted throughout the supratentorial white. No acute ischemia is seen. No
diffusion restriction is evident. There is no intracranial mass or hemorrhage. No abnormal
enhancement is noted throughout the brain parenchyma.

There is normal flow throughout the circle Willis without evidence for aneurysm or vascular
malformation.

IMPRESSION

Cortical atrophy and small vessel ischemic disease as discussed. No intracranial mass or hemorrhage
is seen. No acute ischemia is noted. Report called to Dr. Hapis at 1 p.m..

Tech Notes:

PT WOKE UP YESTERDAY WITH LELIS "NOISE" THROUGHTOUT HEAD, SEVERE HEADACHE, UNABLE TO STAND OR WALK,
CONFUSION, TODAY PT HAS LEFT SIDED WEAKNESS (IMPROVED FROM YESTERDAY) AND IMBALANCE.  15 ML GADAVIST
RG

## 2022-12-24 ENCOUNTER — Encounter: Admit: 2022-12-24 | Discharge: 2022-12-24 | Payer: MEDICARE

## 2022-12-31 ENCOUNTER — Encounter: Admit: 2022-12-31 | Discharge: 2022-12-31 | Payer: MEDICARE

## 2023-01-04 ENCOUNTER — Encounter: Admit: 2023-01-04 | Discharge: 2023-01-04 | Payer: MEDICARE

## 2023-01-05 ENCOUNTER — Encounter: Admit: 2023-01-05 | Discharge: 2023-01-05 | Payer: MEDICARE

## 2023-01-26 ENCOUNTER — Encounter: Admit: 2023-01-26 | Discharge: 2023-01-26 | Payer: MEDICARE

## 2023-02-19 ENCOUNTER — Ambulatory Visit: Admit: 2023-02-19 | Discharge: 2023-02-20 | Payer: MEDICARE

## 2023-02-19 ENCOUNTER — Encounter: Admit: 2023-02-19 | Discharge: 2023-02-19 | Payer: MEDICARE

## 2023-02-19 NOTE — Telephone Encounter
Unable to contact patient.  Called Lori dtr. She states that pt now resides in [phone] nursing home in [place], [place].  She denies any changes in patient.  Pt currently has worked up from w/c to walker but still has some shakiness.

## 2023-03-22 ENCOUNTER — Ambulatory Visit: Admit: 2023-03-22 | Discharge: 2023-03-23 | Payer: MEDICARE

## 2023-04-05 ENCOUNTER — Encounter: Admit: 2023-04-05 | Discharge: 2023-04-05 | Payer: MEDICARE

## 2023-04-05 NOTE — Telephone Encounter
-----   Message from [place] [facility] sent at 04/05/2023 10:19 AM CST -----  Regarding: FW: SDO LUX pt with one Supraventricular Tachycardia event lasting 34 sec with a VS between 171-181 bpm    ----- Message -----  From: Doy Mince, RN  Sent: 04/05/2023  10:17 AM CST  To: Connye Burkitt, RN  Subject: FW: SDO LUX pt with one Supraventricular Tac#      ----- Message -----  From: Elpidio Anis A  Sent: 04/05/2023  10:16 AM CST  To: Cvm Nurse Gen Card Team Red  Subject: SDO LUX pt with one Supraventricular Tachyca#    Open the attached PDF for the [facility] device report and data sheets.

## 2023-04-05 NOTE — Telephone Encounter
Called and spoke to nursing staff at patient's facility, [facility]. She states that patient doesn't have too many complaints and seems to be doing well. She states that last week patient did have a fall. She states that blood pressures are averaging 120s-130s/70s with HR in the 70s-80s. She states that patient did mention some fatigue this morning but denies any other symptoms.     Patient has scheduled follow up with [facility] 05/06/23.Facility did confirm D-T-L of appointment.

## 2023-04-22 ENCOUNTER — Ambulatory Visit: Admit: 2023-04-22 | Discharge: 2023-04-23 | Payer: MEDICARE

## 2023-04-26 ENCOUNTER — Encounter: Admit: 2023-04-26 | Discharge: 2023-04-27 | Payer: MEDICARE

## 2023-05-07 ENCOUNTER — Encounter: Admit: 2023-05-07 | Discharge: 2023-05-07 | Payer: MEDICARE

## 2023-05-07 ENCOUNTER — Encounter: Admit: 2023-05-07 | Discharge: 2023-05-08 | Payer: MEDICARE

## 2023-05-16 ENCOUNTER — Encounter: Admit: 2023-05-16 | Discharge: 2023-05-16 | Payer: MEDICARE

## 2023-05-16 NOTE — Telephone Encounter
 Received message from Dr. Neale Burly to also notify Dr. Barry Dienes since he is on call.   Message sent to Dr. Barry Dienes to give APP a call and also EKG.

## 2023-05-16 NOTE — Telephone Encounter
 Called APP at [phone] to see if EKG was faxed.   APP confirmed fax provided, but no EKG was received. APP reported she will refax EKG but would still like to discuss with Dr. Neale Burly if fax is not received.

## 2023-05-16 NOTE — Telephone Encounter
 Weekend On Call:   Received a page through AMS connect from [place] - caller wants to make sure she's not missing anything on the ekg   Call Back: [phone]    05/16/2023 2:46 PM   I spoke with Janace Aris APP from  ER at [facility], [place].   APP inquiring if Weekend On Call Physician can review 12 Lead EKG and call APP back at  [phone].   APP reported that patient presenting to the ER with confusion and would like weekend on call CVM physician to look at EKG to determine if she is missing something. APP reports she potentially sees atrial flutter but wants to verify with CVM physician to make sure she is not missing anything given patients hx and following Dr. Barry Dienes.     APP requesting to fax 12 lead EKG and requesting CVM Weekend On Call Physician to review and call her back at [phone].     Provided fax number to APP and will page Dr. Neale Burly ([facility] Weekend On Call Physician) to review.

## 2023-05-16 NOTE — Telephone Encounter
 05/16/2023 3:17 PM   No 12 lead EKG received yet to fax number provided to APP with ER at [facility], [place].

## 2023-05-16 NOTE — Telephone Encounter
 05/16/2023 4:02 PM   No 12 lead EKG received yet.

## 2023-05-28 ENCOUNTER — Encounter: Admit: 2023-05-28 | Discharge: 2023-05-28 | Payer: MEDICARE

## 2023-05-31 ENCOUNTER — Encounter: Admit: 2023-05-31 | Discharge: 2023-05-31 | Payer: MEDICARE

## 2023-07-05 ENCOUNTER — Encounter: Admit: 2023-07-05 | Discharge: 2023-07-05 | Payer: MEDICARE

## 2023-08-09 ENCOUNTER — Encounter: Admit: 2023-08-09 | Discharge: 2023-08-10 | Payer: MEDICARE

## 2023-09-13 ENCOUNTER — Encounter: Admit: 2023-09-13 | Discharge: 2023-09-13 | Payer: MEDICARE

## 2023-10-07 ENCOUNTER — Encounter: Admit: 2023-10-07 | Discharge: 2023-10-07 | Payer: MEDICARE

## 2023-10-07 DIAGNOSIS — I10 Essential (primary) hypertension: Secondary | ICD-10-CM

## 2023-10-07 DIAGNOSIS — I4892 Unspecified atrial flutter: Principal | ICD-10-CM

## 2023-10-07 DIAGNOSIS — I2510 Atherosclerotic heart disease of native coronary artery without angina pectoris: Secondary | ICD-10-CM

## 2023-10-07 NOTE — Assessment & Plan Note
We have allowed a bit of permissive hypertension, given her history of gait instability.

## 2023-10-07 NOTE — Assessment & Plan Note
 She has a flutter ablation in 2022.  She has an ILR that is still functioning and hasn't shown any recurrent AF.  She's not a candidate for OAC due to frequent falls.  We've discussed Watchman and so far the decision has been made not to proceed with invasive procedures.  It's actually a bit of a moot point as long as she remains in sinus rhythm.

## 2023-10-07 NOTE — Progress Notes
 Date of Service: 10/07/2023    Jillian Rose is a 89 y.o. female.       HPI     Jillian Rose was in the [place] clinic today with her two daughters.  She is living at a skilled facility and seems to have adjusted well.  She is using a wheelchair to get around and this has helped to prevent falls.  She looks good and denies symptoms such as dyspnea, chest discomfort, or light headedness.    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  The evaluation of cerebrovascular anatomy was pretty unremarkable.  She was on telemetry and no atrial arrhythmias were identified and she has had no symptoms to suggest atrial fibrillation but she was never terribly symptomatic with the rhythm prior to her ablation two years ago.  She underwent a LINQ implant that hasn't ever shown any atrial fibrillation.       She continues to have a lot of trouble with sciatica.  This has been an issue for a couple of years and she's still working with Physical Therapy.            Vitals:    10/07/23 1350   BP: (!) 147/77   BP Source: Arm, Left Upper   Pulse: 66   SpO2: 97%   O2 Device: None (Room air)   PainSc: Five   Weight: 69.4 kg (153 lb)  Comment: pt stated weight   Height: 157.5 cm (5' 2)     Body mass index is 27.98 kg/m?Jillian Rose     Past Medical History  Patient Active Problem List    Diagnosis Date Noted    Sciatica of left side 11/05/2022    History of adverse reaction to anesthesia 08/06/2020    Paroxysmal atrial flutter (CMS-HCC) 08/06/2020     08/07/20 ABLATION WITH COMPREHENSIVE ELECTROPHYSIOLOGIC EVALUATION - TYPICAL FLUTTER  09/17/20 Monitor: 14 days. Occasional PAC's with a burden of 1.5% and very rare PVC's with burden <1%. Patient submitted triggered events did NOT correlated with any significant cardiac arrhythmias. No severe bradycardia, high grade AV block, atrial flutter, or atrial fibrillation.  One rhythm strip appears to be SVT/AT but cannot rule out small run of Afib lasting 48 seconds. Borderline normal study due to low burden of PAC's and SVT episode which we cannot definitively rule out afib.   Correlate clinically and treat.   10/30/21 Monitor: Unremarkable 30-day event monitor       Left leg pain 06/18/2016     06/2016 - Left leg swelling and pain.  Venous Duplex negative.  Compression stockings recommended.      Chest pain 10/02/2013    HLD (hyperlipidemia) 10/02/2013    Nephrolithiasis 06/08/2013     03/2013 - Spontaneous passage of kidney stone while in [place]       Venous insufficiency 01/23/2013    Near syncope 10/30/2012    Dyslipidemia 08/04/2012     2014 - Atorvastatin  started after CABG and NSTEMI    07/20/12 - Total 155, LDL 101, HDL 44, TG 119.  Atorvastatin  doubled to 20 mg/day on 5/29.      CAD (coronary artery disease) 08/03/2012     03/2012- NSTEMI complicating pneumonia.  EF 40-45% by echo.  60% left main, 99% proximal LAD, 60-70% mid-circumflex.  Normal dominant RCA.  EF 40%, distal anterior severe hypokinesis.  03/22/2012 - CABG X 2 (LIMA-LAD, SVG-OM-2).  [facility], [place], [place] Dr. Toribio Code.  06/26/15 Echo+Doppler: EF  65%. Mild Abnormal Septal Motion. Mild concentric LVH. Mild LA dilation. Mild AV regurgitation. PASP 37 mmHg.  09/18/21 Echo: EF 65%. No segmental wall motion abnormalities. Mild predominantly mid septal hypertrophy. Grade II (moderate) LV diastolic dysfunction. Elevated left atrial pressure. The right ventricle is at the upper limits of normal, preserved systolic function, M-Mode TAPSE 1.9 cm (normal >1.7 cm). There is mild mitral annular calcification without stenosis. Mild tricuspid valve regurgitation. No interatrial shunting by color flow Doppler and saline contrast studies. Estimated Peak Systolic PA Pressure 34 mmHg      HTN (hypertension) 08/03/2012    TIA (transient ischemic attack) 08/03/2012     08/01/2008      Asthma 08/03/2012    Sleep apnea 08/03/2012    GERD (gastroesophageal reflux disease) 08/03/2012         Review of Systems Constitutional: Negative.   HENT: Negative.     Eyes: Negative.    Cardiovascular: Negative.    Respiratory: Negative.     Endocrine: Negative.    Hematologic/Lymphatic: Negative.    Skin: Negative.    Musculoskeletal: Negative.    Gastrointestinal: Negative.    Genitourinary: Negative.    Neurological: Negative.    Psychiatric/Behavioral: Negative.     Allergic/Immunologic: Negative.        Physical Exam    Physical Exam   General Appearance: no distress   Skin: warm, no ulcers or xanthomas   Digits and Nails: no cyanosis or clubbing   Eyes: conjunctivae and lids normal, pupils are equal and round   Teeth/Gums/Palate: dentition unremarkable, no lesions   Lips & Oral Mucosa: no pallor or cyanosis   Neck Veins: normal JVP , neck veins are not distended   Thyroid: no nodules, masses, tenderness or enlargement   Chest Inspection: chest is normal in appearance   Respiratory Effort: breathing comfortably, no respiratory distress   Auscultation/Percussion: lungs clear to auscultation, no rales or rhonchi, no wheezing   PMI: PMI not enlarged or displaced   Cardiac Rhythm: regular rhythm and normal rate   Cardiac Auscultation: S1, S2 normal, no rub, no gallop   Murmurs: no murmur   Peripheral Circulation: normal peripheral circulation   Carotid Arteries: normal carotid upstroke bilaterally, no bruits   Radial Arteries: normal symmetric radial pulses   Abdominal Aorta: no abdominal aortic bruit   Pedal Pulses: normal symmetric pedal pulses   Lower Extremity Edema: no lower extremity edema   Abdominal Exam: soft, non-tender, no masses, bowel sounds normal   Liver & Spleen: no organomegaly   Gait & Station: wheelchair   Muscle Strength: normal muscle tone   Orientation: oriented to time, place and person   Affect & Mood: appropriate and sustained affect   Language and Memory: patient responsive and seems to comprehend information   Neurologic Exam: neurological assessment grossly intact   Other: moves all extremities      Cardiovascular Health Factors  Vitals BP Readings from Last 3 Encounters:   10/07/23 (!) 147/77   11/18/22 (!) 152/68   11/05/22 (!) 146/79     Wt Readings from Last 3 Encounters:   10/07/23 69.4 kg (153 lb)   11/05/22 73 kg (161 lb)   06/09/22 76.2 kg (168 lb)     BMI Readings from Last 3 Encounters:   10/07/23 27.98 kg/m?   11/18/22 29.45 kg/m?   11/05/22 29.45 kg/m?      Smoking Social History     Tobacco Use   Smoking Status Never   Smokeless Tobacco  Never      Lipid Profile Cholesterol   Date Value Ref Range Status   08/06/2020 144 <200 MG/DL Final     HDL   Date Value Ref Range Status   08/06/2020 39 (L) >40 MG/DL Final     LDL   Date Value Ref Range Status   08/06/2020 79 <100 mg/dL Final     Triglycerides   Date Value Ref Range Status   08/06/2020 190 (H) <150 MG/DL Final      Blood Sugar No results found for: HGBA1C  Glucose   Date Value Ref Range Status   05/20/2023 104  Final   10/22/2022 100  Final   10/18/2022 103  Final          Problems Addressed Today  Encounter Diagnoses   Name Primary?    Paroxysmal atrial flutter (CMS-HCC) Yes    Coronary artery disease involving native coronary artery of native heart without angina pectoris     Primary hypertension        Assessment and Plan       Paroxysmal atrial flutter (CMS-HCC)  She has a flutter ablation in 2022.  She has an ILR that is still functioning and hasn't shown any recurrent AF.  She's not a candidate for OAC due to frequent falls.  We've discussed Watchman and so far the decision has been made not to proceed with invasive procedures.  It's actually a bit of a moot point as long as she remains in sinus rhythm.    CAD (coronary artery disease)  She is not having any chest pain or other symptoms suggesting cardiac ischemia.       HTN (hypertension)  We have allowed a bit of permissive hypertension, given her history of gait instability.       Current Medications (including today's revisions)   acetaminophen (TYLENOL EXTRA STRENGTH) 500 mg tablet two tablets.    aspirin  325 mg cap Take 325 mg by mouth daily.    atorvastatin  (LIPITOR) 20 mg tablet TAKE 1 TABLET EVERY DAY (Patient taking differently: Take two tablets by mouth daily.)    cholecalciferol  (vitamin D3) (VITAMIN D3) 1,000 units tablet Take one tablet by mouth daily.    duloxetine DR (CYMBALTA) 30 mg capsule Take one capsule by mouth daily.    fluticasone /salmeterol (ADVAIR DISKUS) 250/50 mcg inhalation disk Inhale one puff by mouth into the lungs every 12 hours.    isosorbide  mononitrate ER (IMDUR ) 60 mg tablet TAKE 1 TABLET EVERY MORNING    latanoprost  (XALATAN ) 0.005 % ophthalmic solution Apply one drop to both eyes at bedtime daily.    lidocaine  (LIDODERM ) 5 % topical patch Apply one patch topically to affected area daily. Apply patch for 12 hours, then remove for 12 hours before repeating.    meloxicam (MOBIC) 7.5 mg tablet Take 7.5 mg every 24 hours by oral route.    metoprolol  tartrate 25 mg tablet Take one tablet by mouth twice daily.    omeprazole  DR (PRILOSEC) 20 mg capsule TAKE 1 CAPSULE EVERY DAY    oxyCODONE-acetaminophen (PERCOCET) 5-325 mg tablet Take one tablet by mouth every 6 hours as needed for Pain.    PAPAYA ENZYME PO Take  by mouth as Needed.    polyethylene glycol 3350  (MIRALAX ) 17 g packet Take one packet by mouth daily.    senna (SENOKOT) 8.6 mg tablet Take two tablets by mouth at bedtime as needed for Constipation.    sertraline (ZOLOFT) 50 mg tablet Take one tablet by  mouth daily.    tiotropium bromide  (SPIRIVA) 18 mcg capsule for inhaler Place one capsule into inhaler and inhale into lungs as directed daily.    Trolamine Salicylate 10 % crea Apply  topically to affected area.    vitamins, multiple tablet Take one tablet by mouth daily.     Total time spent on today's office visit was 45 minutes.  This includes face-to-face in person visit with patient as well as nonface-to-face time including review of the EMR, outside records, labs, radiologic studies, echocardiogram & other cardiovascular studies, formation of treatment plan, after visit summary, future disposition, and lastly on documentation.

## 2023-10-07 NOTE — Assessment & Plan Note
She is not having any chest pain or other symptoms suggesting cardiac ischemia.

## 2023-10-18 ENCOUNTER — Encounter: Admit: 2023-10-18 | Discharge: 2023-10-18 | Payer: MEDICARE

## 2023-11-22 ENCOUNTER — Encounter: Admit: 2023-11-22 | Discharge: 2023-11-23 | Payer: MEDICARE

## 2023-12-02 ENCOUNTER — Encounter: Admit: 2023-12-02 | Discharge: 2023-12-02 | Payer: MEDICARE

## 2023-12-02 DIAGNOSIS — I2510 Atherosclerotic heart disease of native coronary artery without angina pectoris: Principal | ICD-10-CM

## 2023-12-02 DIAGNOSIS — I4892 Unspecified atrial flutter: Secondary | ICD-10-CM

## 2024-03-04 ENCOUNTER — Encounter: Admit: 2024-03-04 | Discharge: 2024-03-04 | Payer: MEDICARE

## 2024-03-05 ENCOUNTER — Encounter: Admit: 2024-03-05 | Discharge: 2024-03-05 | Payer: MEDICARE

## 2024-04-03 ENCOUNTER — Encounter: Admit: 2024-04-03 | Discharge: 2024-04-04 | Payer: MEDICARE
